# Patient Record
Sex: Female | Born: 1975 | Race: Black or African American | Hispanic: No | Marital: Single | Smoking: Former smoker
Health system: Southern US, Community
[De-identification: ages and names within clinical notes are randomized; demographics above are authoritative.]

## PROBLEM LIST (undated history)

## (undated) DIAGNOSIS — O24419 Gestational diabetes mellitus in pregnancy, unspecified control: Secondary | ICD-10-CM

## (undated) DIAGNOSIS — O1490 Unspecified pre-eclampsia, unspecified trimester: Secondary | ICD-10-CM

## (undated) HISTORY — DX: Gestational diabetes mellitus in pregnancy, unspecified control: O24.419

## (undated) HISTORY — PX: TUBAL LIGATION: SHX77

## (undated) HISTORY — DX: Unspecified pre-eclampsia, unspecified trimester: O14.90

---

## 1997-07-08 ENCOUNTER — Other Ambulatory Visit: Admission: RE | Admit: 1997-07-08 | Discharge: 1997-07-08 | Payer: Self-pay | Admitting: Obstetrics and Gynecology

## 1997-09-09 ENCOUNTER — Emergency Department (HOSPITAL_COMMUNITY): Admission: EM | Admit: 1997-09-09 | Discharge: 1997-09-09 | Payer: Self-pay | Admitting: Emergency Medicine

## 1999-01-07 ENCOUNTER — Other Ambulatory Visit: Admission: RE | Admit: 1999-01-07 | Discharge: 1999-01-07 | Payer: Self-pay | Admitting: Obstetrics and Gynecology

## 1999-05-08 ENCOUNTER — Other Ambulatory Visit: Admission: RE | Admit: 1999-05-08 | Discharge: 1999-05-08 | Payer: Self-pay | Admitting: Obstetrics and Gynecology

## 1999-10-21 ENCOUNTER — Emergency Department (HOSPITAL_COMMUNITY): Admission: EM | Admit: 1999-10-21 | Discharge: 1999-10-21 | Payer: Self-pay | Admitting: Emergency Medicine

## 1999-10-22 ENCOUNTER — Other Ambulatory Visit: Admission: RE | Admit: 1999-10-22 | Discharge: 1999-10-22 | Payer: Self-pay | Admitting: *Deleted

## 2000-03-01 ENCOUNTER — Other Ambulatory Visit: Admission: RE | Admit: 2000-03-01 | Discharge: 2000-03-01 | Payer: Self-pay | Admitting: Obstetrics and Gynecology

## 2003-12-10 ENCOUNTER — Other Ambulatory Visit: Admission: RE | Admit: 2003-12-10 | Discharge: 2003-12-10 | Payer: Self-pay | Admitting: Obstetrics and Gynecology

## 2003-12-13 ENCOUNTER — Ambulatory Visit (HOSPITAL_COMMUNITY): Admission: RE | Admit: 2003-12-13 | Discharge: 2003-12-13 | Payer: Self-pay | Admitting: Obstetrics and Gynecology

## 2003-12-17 ENCOUNTER — Inpatient Hospital Stay (HOSPITAL_COMMUNITY): Admission: AD | Admit: 2003-12-17 | Discharge: 2003-12-17 | Payer: Self-pay | Admitting: Obstetrics and Gynecology

## 2003-12-19 ENCOUNTER — Inpatient Hospital Stay (HOSPITAL_COMMUNITY): Admission: AD | Admit: 2003-12-19 | Discharge: 2003-12-19 | Payer: Self-pay | Admitting: Obstetrics and Gynecology

## 2003-12-22 ENCOUNTER — Inpatient Hospital Stay (HOSPITAL_COMMUNITY): Admission: AD | Admit: 2003-12-22 | Discharge: 2003-12-23 | Payer: Self-pay | Admitting: Obstetrics and Gynecology

## 2004-01-03 ENCOUNTER — Ambulatory Visit (HOSPITAL_COMMUNITY): Admission: RE | Admit: 2004-01-03 | Discharge: 2004-01-03 | Payer: Self-pay | Admitting: Obstetrics and Gynecology

## 2004-01-08 ENCOUNTER — Encounter: Payer: Self-pay | Admitting: Obstetrics and Gynecology

## 2004-01-09 ENCOUNTER — Encounter (INDEPENDENT_AMBULATORY_CARE_PROVIDER_SITE_OTHER): Payer: Self-pay | Admitting: Specialist

## 2004-01-09 ENCOUNTER — Ambulatory Visit (HOSPITAL_COMMUNITY): Admission: RE | Admit: 2004-01-09 | Discharge: 2004-01-09 | Payer: Self-pay | Admitting: Obstetrics and Gynecology

## 2004-07-17 ENCOUNTER — Emergency Department (HOSPITAL_COMMUNITY): Admission: EM | Admit: 2004-07-17 | Discharge: 2004-07-17 | Payer: Self-pay | Admitting: Emergency Medicine

## 2004-07-20 ENCOUNTER — Emergency Department (HOSPITAL_COMMUNITY): Admission: EM | Admit: 2004-07-20 | Discharge: 2004-07-20 | Payer: Self-pay | Admitting: Emergency Medicine

## 2006-04-07 ENCOUNTER — Inpatient Hospital Stay (HOSPITAL_COMMUNITY): Admission: AD | Admit: 2006-04-07 | Discharge: 2006-04-08 | Payer: Self-pay | Admitting: Obstetrics and Gynecology

## 2006-04-21 ENCOUNTER — Ambulatory Visit: Payer: Self-pay | Admitting: Internal Medicine

## 2006-04-28 ENCOUNTER — Encounter: Payer: Self-pay | Admitting: Cardiology

## 2006-04-28 ENCOUNTER — Ambulatory Visit: Payer: Self-pay

## 2006-05-31 ENCOUNTER — Ambulatory Visit: Payer: Self-pay | Admitting: Internal Medicine

## 2006-05-31 LAB — CONVERTED CEMR LAB
Basophils Absolute: 0 10*3/uL (ref 0.0–0.1)
Basophils Relative: 0.2 % (ref 0.0–1.0)
Eosinophils Absolute: 0.1 10*3/uL (ref 0.0–0.6)
Eosinophils Relative: 0.8 % (ref 0.0–5.0)
HCT: 34.1 % — ABNORMAL LOW (ref 36.0–46.0)
Hemoglobin: 11.8 g/dL — ABNORMAL LOW (ref 12.0–15.0)
Lymphocytes Relative: 15 % (ref 12.0–46.0)
MCHC: 34.5 g/dL (ref 30.0–36.0)
MCV: 92.5 fL (ref 78.0–100.0)
Monocytes Absolute: 0.9 10*3/uL — ABNORMAL HIGH (ref 0.2–0.7)
Monocytes Relative: 9.5 % (ref 3.0–11.0)
Neutro Abs: 7.5 10*3/uL (ref 1.4–7.7)
Neutrophils Relative %: 74.5 % (ref 43.0–77.0)
Platelets: 191 10*3/uL (ref 150–400)
RBC: 3.68 M/uL — ABNORMAL LOW (ref 3.87–5.11)
RDW: 12.5 % (ref 11.5–14.6)
WBC: 10 10*3/uL (ref 4.5–10.5)

## 2006-06-13 ENCOUNTER — Encounter: Admission: RE | Admit: 2006-06-13 | Discharge: 2006-06-13 | Payer: Self-pay | Admitting: Obstetrics and Gynecology

## 2006-07-19 ENCOUNTER — Ambulatory Visit: Payer: Self-pay | Admitting: Internal Medicine

## 2006-09-13 ENCOUNTER — Inpatient Hospital Stay (HOSPITAL_COMMUNITY): Admission: AD | Admit: 2006-09-13 | Discharge: 2006-09-13 | Payer: Self-pay | Admitting: Obstetrics and Gynecology

## 2006-09-22 ENCOUNTER — Encounter: Admission: RE | Admit: 2006-09-22 | Discharge: 2006-10-05 | Payer: Self-pay | Admitting: Obstetrics and Gynecology

## 2006-10-11 ENCOUNTER — Ambulatory Visit: Payer: Self-pay | Admitting: Family Medicine

## 2006-10-15 ENCOUNTER — Inpatient Hospital Stay (HOSPITAL_COMMUNITY): Admission: AD | Admit: 2006-10-15 | Discharge: 2006-10-16 | Payer: Self-pay | Admitting: Obstetrics & Gynecology

## 2006-10-18 ENCOUNTER — Ambulatory Visit: Payer: Self-pay | Admitting: Obstetrics & Gynecology

## 2006-10-25 ENCOUNTER — Ambulatory Visit: Payer: Self-pay | Admitting: Obstetrics & Gynecology

## 2006-11-01 ENCOUNTER — Ambulatory Visit: Payer: Self-pay | Admitting: Family Medicine

## 2006-11-07 ENCOUNTER — Inpatient Hospital Stay (HOSPITAL_COMMUNITY): Admission: RE | Admit: 2006-11-07 | Discharge: 2006-11-07 | Payer: Self-pay | Admitting: Obstetrics and Gynecology

## 2006-11-08 ENCOUNTER — Encounter (INDEPENDENT_AMBULATORY_CARE_PROVIDER_SITE_OTHER): Payer: Self-pay | Admitting: Obstetrics and Gynecology

## 2006-11-08 ENCOUNTER — Inpatient Hospital Stay (HOSPITAL_COMMUNITY): Admission: RE | Admit: 2006-11-08 | Discharge: 2006-11-11 | Payer: Self-pay | Admitting: Obstetrics and Gynecology

## 2006-11-15 ENCOUNTER — Inpatient Hospital Stay (HOSPITAL_COMMUNITY): Admission: AD | Admit: 2006-11-15 | Discharge: 2006-11-17 | Payer: Self-pay | Admitting: Obstetrics and Gynecology

## 2007-01-23 ENCOUNTER — Ambulatory Visit: Payer: Self-pay | Admitting: Family Medicine

## 2007-01-23 DIAGNOSIS — O99810 Abnormal glucose complicating pregnancy: Secondary | ICD-10-CM | POA: Insufficient documentation

## 2007-01-23 LAB — CONVERTED CEMR LAB: Blood Glucose, Fingerstick: 81

## 2007-01-25 LAB — CONVERTED CEMR LAB
BUN: 10 mg/dL (ref 6–23)
CO2: 31 meq/L (ref 19–32)
Calcium: 9.5 mg/dL (ref 8.4–10.5)
Chloride: 102 meq/L (ref 96–112)
Creatinine, Ser: 0.7 mg/dL (ref 0.4–1.2)
GFR calc Af Amer: 127 mL/min
GFR calc non Af Amer: 105 mL/min
Glucose, Bld: 80 mg/dL (ref 70–99)
Hgb A1c MFr Bld: 6.2 % — ABNORMAL HIGH (ref 4.6–6.0)
Potassium: 4.3 meq/L (ref 3.5–5.1)
Sodium: 140 meq/L (ref 135–145)
TSH: 0.69 microintl units/mL (ref 0.35–5.50)

## 2007-06-20 ENCOUNTER — Emergency Department (HOSPITAL_COMMUNITY): Admission: EM | Admit: 2007-06-20 | Discharge: 2007-06-20 | Payer: Self-pay | Admitting: Emergency Medicine

## 2007-06-20 ENCOUNTER — Telehealth: Payer: Self-pay | Admitting: Family Medicine

## 2007-06-28 ENCOUNTER — Ambulatory Visit: Payer: Self-pay | Admitting: Family Medicine

## 2007-06-28 DIAGNOSIS — R51 Headache: Secondary | ICD-10-CM

## 2007-06-28 DIAGNOSIS — R519 Headache, unspecified: Secondary | ICD-10-CM | POA: Insufficient documentation

## 2008-09-25 ENCOUNTER — Ambulatory Visit: Payer: Self-pay | Admitting: Family Medicine

## 2008-09-25 DIAGNOSIS — J069 Acute upper respiratory infection, unspecified: Secondary | ICD-10-CM | POA: Insufficient documentation

## 2009-02-12 ENCOUNTER — Ambulatory Visit: Payer: Self-pay | Admitting: Family Medicine

## 2009-02-12 DIAGNOSIS — R490 Dysphonia: Secondary | ICD-10-CM

## 2009-09-02 ENCOUNTER — Ambulatory Visit: Payer: Self-pay | Admitting: Family Medicine

## 2009-09-02 DIAGNOSIS — R599 Enlarged lymph nodes, unspecified: Secondary | ICD-10-CM | POA: Insufficient documentation

## 2009-09-02 DIAGNOSIS — K5900 Constipation, unspecified: Secondary | ICD-10-CM | POA: Insufficient documentation

## 2009-09-03 LAB — CONVERTED CEMR LAB
Basophils Absolute: 0 10*3/uL (ref 0.0–0.1)
Basophils Relative: 0.3 % (ref 0.0–3.0)
EBV VCA IgG: 6.31 — ABNORMAL HIGH
EBV VCA IgM: 0.42
Eosinophils Absolute: 0 10*3/uL (ref 0.0–0.7)
Eosinophils Relative: 0.3 % (ref 0.0–5.0)
HCT: 43.2 % (ref 36.0–46.0)
Hemoglobin: 14.5 g/dL (ref 12.0–15.0)
Lymphocytes Relative: 22.2 % (ref 12.0–46.0)
Lymphs Abs: 1.3 10*3/uL (ref 0.7–4.0)
MCHC: 33.6 g/dL (ref 30.0–36.0)
MCV: 93.1 fL (ref 78.0–100.0)
Monocytes Absolute: 0.8 10*3/uL (ref 0.1–1.0)
Monocytes Relative: 13 % — ABNORMAL HIGH (ref 3.0–12.0)
Neutro Abs: 3.9 10*3/uL (ref 1.4–7.7)
Neutrophils Relative %: 64.2 % (ref 43.0–77.0)
Platelets: 186 10*3/uL (ref 150.0–400.0)
RBC: 4.64 M/uL (ref 3.87–5.11)
RDW: 13.2 % (ref 11.5–14.6)
WBC: 6 10*3/uL (ref 4.5–10.5)

## 2010-02-10 NOTE — Assessment & Plan Note (Signed)
Summary: swollen glands on lft side of neck/very sore/fatique/cjr   Vital Signs:  Patient profile:   35 year old female Weight:      119 pounds BMI:     19.87 Temp:     98.8 degrees F oral BP sitting:   94 / 60  (left arm) Cuff size:   regular  Vitals Entered By: Raechel Ache, RN (September 02, 2009 9:46 AM) CC: C/o swollen, tender neck on L side, constipation and tender belly.   Allergies (verified): No Known Drug Allergies  Past History:  Past Medical History: Gestational diabetes with her last pregnancy pre-eclampsia with her last pregnancy sees Dr. Maxie Better for GYN exams  Past Surgical History:  Tubal ligation   Impression & Recommendations:  Problem # 1:  CERVICAL LYMPHADENOPATHY (ICD-785.6)  Her updated medication list for this problem includes:    Cefdinir 300 Mg Caps (Cefdinir) .Marland Kitchen..Marland Kitchen Two times a day  Orders: Venipuncture (16109) TLB-CBC Platelet - w/Differential (85025-CBCD) T- * Misc. Laboratory test [phone])  Problem # 2:  CONSTIPATION (ICD-564.00)  Her updated medication list for this problem includes:    Miralax Powd (Polyethylene glycol 3350) ..... Once daily  Complete Medication List: 1)  Mirena 20 Mcg/24hr Iud (Levonorgestrel) 2)  Cefdinir 300 Mg Caps (Cefdinir) .... Two times a day 3)  Miralax Powd (Polyethylene glycol 3350) .... Once daily  Patient Instructions: 1)  Start on Cefdinir. Get labs. Start on Miralax daily.  Prescriptions: CEFDINIR 300 MG CAPS (CEFDINIR) two times a day  #20 x 0   Entered and Authorized by:   Nelwyn Salisbury MD   Signed by:   Nelwyn Salisbury MD on 09/02/2009   Method used:   Electronically to        [facility] [address] [id number]* (retail)       [address] [place], [place]  [address]       Ph: [phone]       Fax: [phone]   RxID:   [id number]   Appended Document: swollen glands on lft side of neck/very sore/fatique/cjr     Allergies: No Known Drug Allergies  Review of  Systems  The patient denies anorexia, fever, weight loss, weight gain, vision loss, decreased hearing, hoarseness, chest pain, syncope, dyspnea on exertion, peripheral edema, prolonged cough, headaches, hemoptysis, melena, hematochezia, severe indigestion/heartburn, hematuria, incontinence, genital sores, muscle weakness, suspicious skin lesions, transient blindness, difficulty walking, depression, unusual weight change, abnormal bleeding, enlarged lymph nodes, angioedema, breast masses, and testicular masses.    Physical Exam  General:  Well-developed,well-nourished,in no acute distress; alert,appropriate and cooperative throughout examination Head:  Normocephalic and atraumatic without obvious abnormalities. No apparent alopecia or balding. Eyes:  No corneal or conjunctival inflammation noted. EOMI. Perrla. Funduscopic exam benign, without hemorrhages, exudates or papilledema. Vision grossly normal. Ears:  External ear exam shows no significant lesions or deformities.  Otoscopic examination reveals clear canals, tympanic membranes are intact bilaterally without bulging, retraction, inflammation or discharge. Hearing is grossly normal bilaterally. Nose:  External nasal examination shows no deformity or inflammation. Nasal mucosa are pink and moist without lesions or exudates. Mouth:  OP is red without exudate Neck:  shotty tender AC nodes Lungs:  Normal respiratory effort, chest expands symmetrically. Lungs are clear to auscultation, no crackles or wheezes.   Complete Medication List: 1)  Mirena 20 Mcg/24hr Iud (Levonorgestrel) 2)  Cefdinir 300 Mg Caps (Cefdinir) .... Two times a day 3)  Miralax Powd (Polyethylene glycol  3350) .... Once daily

## 2010-02-10 NOTE — Assessment & Plan Note (Signed)
Summary: LARYNGITIS, CONGESTION // RS   Vital Signs:  Patient profile:   35 year old female Weight:      117 pounds Temp:     98.8 degrees F oral Pulse rate:   101 / minute BP sitting:   102 / 62  (left arm) Cuff size:   regular  Vitals Entered By: Alfred Levins, CMA (February 12, 2009 9:33 AM) CC: laryngitis x4 days   History of Present Illness: Here for 4 days of hoarseness which started last weekend. She has no other symptoms at all except some mild sneezing and some PND. No fever or ST or cough. On no meds. She has not been exposed to smoke or chemicals, and she has not been singing or straining her voice. She works on the phone all day as a Engineer, building services, so today she did not go to work.   Current Medications (verified): 1)  None  Allergies (verified): No Known Drug Allergies  Past History:  Past Medical History: Reviewed history from 01/23/2007 and no changes required. Gestational diabetes with her last pregnancy pre-eclampsia with her last pregnancy  Past Surgical History: Reviewed history from 01/23/2007 and no changes required. Denies surgical history  Review of Systems  The patient denies anorexia, fever, weight loss, weight gain, vision loss, decreased hearing, hoarseness, chest pain, syncope, dyspnea on exertion, peripheral edema, prolonged cough, headaches, hemoptysis, abdominal pain, melena, hematochezia, severe indigestion/heartburn, hematuria, incontinence, genital sores, muscle weakness, suspicious skin lesions, transient blindness, difficulty walking, depression, unusual weight change, abnormal bleeding, enlarged lymph nodes, angioedema, breast masses, and testicular masses.    Physical Exam  General:  Well-developed,well-nourished,in no acute distress; alert,appropriate and cooperative throughout examination Head:  Normocephalic and atraumatic without obvious abnormalities. No apparent alopecia or balding. Eyes:  No corneal or conjunctival  inflammation noted. EOMI. Perrla. Funduscopic exam benign, without hemorrhages, exudates or papilledema. Vision grossly normal. Ears:  External ear exam shows no significant lesions or deformities.  Otoscopic examination reveals clear canals, tympanic membranes are intact bilaterally without bulging, retraction, inflammation or discharge. Hearing is grossly normal bilaterally. Nose:  External nasal examination shows no deformity or inflammation. Nasal mucosa are pink and moist without lesions or exudates. Mouth:  Oral mucosa and oropharynx without lesions or exudates.  Teeth in good repair. Neck:  No deformities, masses, or tenderness noted. Lungs:  Normal respiratory effort, chest expands symmetrically. Lungs are clear to auscultation, no crackles or wheezes. Her voice is hoarse   Impression & Recommendations:  Problem # 1:  HOARSENESS (AVW-098.11)  Patient Instructions: 1)  this possibly has an allergic etiology, but it is unclear. We will put her out of work today and tomorrow, she is to rest her voice as much as possible and drink lots of fluids. try Claritin for a few days.  2)  Please schedule a follow-up appointment as needed .

## 2010-05-26 NOTE — Assessment & Plan Note (Signed)
[facility]                             PULMONARY OFFICE NOTE   NAME:[name], Samantha Stuart                     MRN:          [id number]  DATE:07/19/2006                            DOB:          1975-01-29    PROBLEM LIST:  1. Dyspnea.  2. Elevated D-dimer.  3. Pregnant with twins.   HISTORY:  Her pregnancy proceeds apparently without problems.  She is  more comfortable now and feels that she is just adjusting to a pregnancy  with twins.  I went over with her the input we had received from Hulen Luster, PharmD, who sought opinion from his colleagues about the D-dimer  issue while pregnant.  Consensus was that pregnancy does raise the D-  dimer leaving [place] unsure of significance in the absence of other clues.  Some would be willing to utilize radioactive testing modalities, others  have felt we would have been better not to do the D-dimer test in the  first place, so the question would not come up.  [name] was here  with her mother and we reviewed all of this.  She has had no sudden  events, no discomfort with her legs, nothing new.   MEDICATIONS:  Prenatal vitamins.   No medication allergy.   OBJECTIVE:  VITAL SIGNS:  Weight  133 pounds, BP 108/58, pulse 79, room  air saturation 100%.  Her pulse is regular.  HEART:  There is a trace systolic flow murmur at the left sternal  border.  Heart sounds are regular.  NECK:  There is no neck vein distention or stridor.  LUNGS:  Quiet and clear.  Breathing is unlabored.  EXTREMITIES:  Calves are soft, no edema.  Negative Homan's.   IMPRESSION:  We really do not see any reason to assume her initial  dyspnea complaint was based on anything more than pregnancy with twins  at this point.   I reviewed circumstances that might suggest pulmonary embolism or deep  vein thrombosis.  She is quite comfortable letting the issue drop and  returning p.r.n.  I think it is very reasonable to let her be followed  routinely by Dr. Cherly Hensen and I would be happy to see her again if there  are future concerns.    Clinton D. Maple Hudson, MD, Tonny Bollman, FACP  Electronically Signed   CDY/MedQ  DD: 08/06/2006  DT: 08/08/2006  Job #: [id number]   cc:   Maxie Better, M.D.

## 2010-05-26 NOTE — Op Note (Signed)
NAMEASHLEYANN, Samantha Stuart              ACCOUNT NO.:  [id number]   MEDICAL RECORD NO.:  [id number]          PATIENT TYPE:  INP   LOCATION:                                FACILITY[facility]  [facility]   PHYSICIAN:  Maxie Better, M.D.DATE OF BIRTH:  1975/09/25   DATE OF PROCEDURE:  11/08/2006  DATE OF DISCHARGE:                               OPERATIVE REPORT   PREOPERATIVE DIAGNOSES:  1. Twin gestation at 55 weeks with mature fetal lung indices.  2. Class A2 gestational diabetes.  3. Desires sterilization procedure.   POSTOPERATIVE DIAGNOSES:  1. Twin gestation at 18 weeks with mature fetal lung indices.  2. Class A2 gestational diabetes.  3. Desires sterilization.   OPERATION/PROCEDURE:  1. Primary cesarean section.  2. Modified Pomeroy tubal ligation.   ANESTHESIA:  Spinal.   SURGEON:  Maxie Better, M.D..   ASSISTANT:  Marlinda Mike, C.N.M.   DESCRIPTION OF PROCEDURE:  Under adequate spinal anesthesia, the patient  is placed in the supine position with a left lateral tilt.  She was  sterilely prepped and draped in the usual fashion.  Indwelling Foley  catheter was sterilely placed.  Marcaine 0.25% was injected along the  planned Pfannenstiel skin incision.  Pfannenstiel skin incision was then  made, carried down to the rectus fascia.  The rectus fascia was opened  transversely.  The rectus fascia was then bluntly and sharply dissected  off the rectus muscle in the superior and inferior fashion.  Rectus  muscles split in midline.  The parietal peritoneum was entered sharply  and extended.  The vesicouterine peritoneum was opened transversely.  Bladder was then bluntly dissected off the lower uterine segment.  Large  venous vessels were noted in the lower uterine segment.  Nonetheless, a  low transverse uterine incision was then made and extended with bandage  scissors.  Artificial rupture of membranes had been for performed.  Clear fluid noted.  Subsequent delivery of a live female  from the maternal  left was accomplished, was bulb suctioned on the abdomen.  Cord was  clamped and cut.  The baby had a cord around the left foot.  Baby was  transferred to the awaiting pediatrician who assigned Apgars of 8 and 9  at one and five minutes.  Second amniotic membranes was intact.  After  identifying and stabilizing the presentation, artificial rupture of  membranes was performed on the second twin.  Subsequent delivery of a  live female was also accomplished.  Baby was bulb-suctioned on the  abdomen.  Cord was clamped and cut.  The baby was transferred to the  awaiting pediatrician who also assigned Apgars of 8 and 9 at one and  five minutes.  The placenta which was x2 was manually removed.  Uterine  cavity was cleaned of debris.  Uterine incision had no extension, was  closed in two layers, the first layer of 0 Monocryl running locked  stitch and second layer with imbricating using 0 Monocryl suture.  Bleeding along the intermittently along the incision site was hemostased  with figure-of-eight 0 Monocryl sutures.  The abdomen was then copiously  irrigated, suctioned of debris and small bleeders were cauterized along  the peritoneal edges.   Attention was then turned to the fallopian tubes and after the  reascertaining that the patient had desires to proceed with the  sterilization.  Both ovaries were noted to be normal.  Both fallopian  tubes were identified down to the fimbriated end.  Mid portion of tube  was grasped with a Babcock.  The underlying mesosalpinx was opened with  cautery.  The proximal distal portion of tube was tied with 0 chromic  suture x2 proximally and distally.  The intervening segment of tube was  removed on both sides and sent to pathology.   Abdomen was again copiously irrigated, suctioned, small bleeders  cauterized with good hemostasis noted.  The vesicouterine peritoneum was  not closed.  The parietal peritoneum was closed with 2-0 Vicryl  suture.  The skin approximated using a 0 Vicryl x2.  There was hardly any  subcutaneous fat.  Skin was approximated using Ethicon staples.   SPECIMENS:  Placenta x2 sent to pathology.  Portion of right and left  fallopian tube also sent to pathology.   ESTIMATED BLOOD LOSS:  1000 mL.   INTRAOPERATIVE FLUID:  2900 mL crystalloid.   URINARY OUTPUT:  Urine output was 40 mL clear yellow urine.   COUNTS:  Sponge and instrument counts x2 were correct.   COMPLICATIONS:  None.   DISPOSITION:  Weight of the babies was 5 pounds 7 ounces and 5 pounds 12  ounces respectively.  The patient tolerated the procedure well and was  transferred to recovery in stable condition.      Maxie Better, M.D.  Electronically Signed     [place]/MEDQ  D:  11/08/2006  T:  11/09/2006  Job:  [id number]

## 2010-05-26 NOTE — Assessment & Plan Note (Signed)
[facility]                             PULMONARY OFFICE NOTE   NAME:Samantha Stuart, Samantha Stuart                     MRN:          [id number]  DATE:05/31/2006                            DOB:          [birth year]    PROBLEM:  1. Dyspnea.  2. Elevated D-dimer.  3. Pregnant with twins.   HISTORY:  She returns for followup of pregnancy progressing normally,  due November 25.  She reports no change in shortness of breath,  especially with exertion.  Sometimes she will get a minor substernal  tingle but no active reflux, no exertional chest pain or pleuritic  pains.  She has to sleep on her back to avoid shortness of breath at  night and has trouble with stairs or walking across a parking lot.  Spirometry, April 10, had shown mild restriction with an FEV1/FEC ratio  of 0.67 indicating some obstructive component.  Scores went down after  bronchodilator.  Chest x-ray had shown no active disease.  Dopplers of  leg veins were negative.  The D-dimer on April 10 was 0.57.  An  echocardiogram was normal with an ejection fraction of 65% and no  evidence of right ventricular overload.  Dopplers of leg veins were  negative with no evidence of clot  or obstruction.  My staff walked her  in the hallway for a total of 185 feet.  Initial heart rate at rest was  102 and at the end of the exercise was 94.  Initial room air saturation  at rest was 100%.  Saturation at the end of the exercise was 99% on room  air.  She did have to stop for a minute to catch her breath but oxygen  saturations never dropped below 97%.  She complained of dizziness.  A  CBC, May 20, recorded a hemoglobin of 11.8 with hematocrit of 34.1.  This does not seem low enough to cause symptoms.  A repeat D-dimer on  this date (May 31, 2006), was higher at 1.52.  I emailed Hulen Luster,  Pharm D. who is an expert on clotting issues in the pharmacy at [facility] to  enquire about D-dimer during pregnancy.  My own  research had already  indicated that it was apt to go up during pregnancy but that it is  varied depending on the particular assay used.  He sent the clinical  issue out to a number of the national level experts with whom he works,  and we received a series of responses.  Most felt that the D-dimer assay  was going to go up during pregnancy and did not mean much of anything.  One felt that the radiation risk was so small as to be relatively worth  ignoring and recommended that a ventilation perfusion lung scan be done.   IMPRESSION:  I favored this being dyspnea related to the physiologic  burden of being pregnant with twins and very much doubt that she has had  a pulmonary embolism.  However, there is uncertainty. I have discussed  this with her, and I will contact Dr. Cherly Hensen to review options.  Currently, I have scheduled return in two months simply to have a date  on the calendar but  expect to have further clinical decisions made soon.  Ms Humm  understands the uncertainty and the importance of contacting us if there  is a clinical change for the worse.     Clinton D. Maple Hudson, MD, Tonny Bollman, FACP  Electronically Signed    CDY/MedQ  DD: 06/13/2006  DT: 06/14/2006  Job #: [id number]   cc:   Maxie Better, M.D.

## 2010-05-29 NOTE — Op Note (Signed)
Samantha Stuart, Samantha Stuart              ACCOUNT NO.:  [id number]   MEDICAL RECORD NO.:  [id number]          PATIENT TYPE:  AMB   LOCATION:  SDC                           FACILITY:  [facility]   PHYSICIAN:  Osborn Coho, M.D.   DATE OF BIRTH:  28-May-1975   DATE OF PROCEDURE:  DATE OF DISCHARGE:                                 OPERATIVE REPORT   PREOPERATIVE DIAGNOSIS:  Missed abortion versus ectopic.   POSTOPERATIVE DIAGNOSIS:  Missed abortion versus ectopic.   PROCEDURE:  Suction dilation and curettage.   ANESTHESIA:  MAC.   ATTENDING:  Osborn Coho, M.D.   FLUIDS:  600 mL.   ESTIMATED BLOOD LOSS:  Minimal (less than 10 mL).   URINE OUTPUT:  Approximately 50 mL via straight catheter prior to procedure.   COMPLICATIONS:  None.   FINDINGS:  A small amount of POCs.   PATHOLOGY:  POCs.   PROCEDURE IN DETAIL:  The patient was taken to the operating room after the  risks, benefits, and alternatives discussed with the patient.  The patient  verbalized understanding and consent signed and witnessed.  The patient was  placed under MAC for anesthesia and prepped and draped in the normal sterile  fashion in the dorsal lithotomy position.  A bivalve speculum was placed in  the patient's vagina and a paracervical block administered with a total of  10 mL of 2% lidocaine.  The anterior lip of the cervix was grasped with a  single-tooth tenaculum and the cervix was dilated for passage of a size 7  suctioning curette.  The uterus was sounded to 8 cm.  Suction curettage was  performed with a small amount of products of conception returning.  Sharp  curettage was performed and a gritty texture was noted.  Suction curettage  was performed once again to remove any remaining debris.  Instruments were  removed.  Hemostasis was noted at the tenaculum site.  Sponge and lap count  were correct.  The patient tolerated the procedure well and was returned to  the recovery room in good condition.     Ange   AR/MEDQ  D:  01/09/2004  T:  01/09/2004  Job:  [id number]

## 2010-05-29 NOTE — Discharge Summary (Signed)
NAMESYBRINA, Samantha Stuart              ACCOUNT NO.:  [id number]   MEDICAL RECORD NO.:  [id number]          PATIENT TYPE:  INP   LOCATION:  9148                          [facility]ILITY[facility]   PHYSICIAN:  Maxie Better, M.D.DATE OF BIRTH:  [birth year]   DATE OF ADMISSION:  11/08/2006  DATE OF DISCHARGE:  11/11/2006                               DISCHARGE SUMMARY   ADMISSION DIAGNOSIS:  1. Twin gestation.  2. Intrauterine gestation of 36 weeks.  3. Class A2 gestational diabetes.  4. Desires sterilization.   DISCHARGE DIAGNOSIS:  1. Twin gestation delivered.  2. Intrauterine gestation at 36+ weeks.  3. Class A2 gestational diabetes.  4. Desires sterilization.   PROCEDURE:  Primary cesarean section, modified Pomeroy tubal ligation.   HISTORY OF PRESENT ILLNESS:  This is a 35 year old gravida 6, para 1-0-4-  1 female at 15 weeks with class A2 gestational diabetes and history of  preterm labor is now admitted for a primary cesarean section after  amniocentesis documented mature fetal lung indices. The patient requests  permanent sterilization.   HOSPITAL COURSE:  The patient was admitted to [facility].  She  underwent a primary cesarean section, live female x2 were delivered twin  A was 5 pounds 7 ounces twin B was 5 pounds 12 ounces, Apgars of 8 and 9  for both. Normal tubes and ovaries were noted bilaterally.  The patient  had an uncomplicated post operative course.  The pathology confirmed  portion of the tube had been transected. Her CBC on postoperative day #1  showed a hemoglobin 9.4, hematocrit 27.5, white count 10.3, platelet  count 188,000. By postop day #3 the patient had slight erythema of the  incision tenderness above and was empirically started on Keflex.  She  tolerated a regular diet.  She was supplementing and as well as breast  feeding. She was otherwise deemed well to be discharged.   DISPOSITION:  Home.   DISCHARGE MEDICATIONS:  Were  1. Percocet 1-2 tablets  every 3-4 hours p.r.n. pain.  2. Motrin 600 mg one p.o. every 8 hours  p.r.n. pain.  3. Keflex 1000 mg one p.o. every 6 hours for 7 days.  4. Iron supplementation one p.o. daily.   CONDITION:  Stable.   FOLLOW-UP APPOINTMENT:  At [facility] for 6 weeks postpartum and  for 1 week for staple removal and incision check.   DISCHARGE INSTRUCTIONS:  Otherwise per the postpartum booklet given.      Maxie Better, M.D.  Electronically Signed     [place]/MEDQ  D:  12/06/2006  T:  12/06/2006  Job:  [id number]

## 2010-05-29 NOTE — Assessment & Plan Note (Signed)
[facility]                             PULMONARY OFFICE NOTE   NAME:Samantha Stuart, Samantha Stuart                     MRN:          [id number]  DATE:04/21/2006                            DOB:          10/28/1975    PROBLEM:  Pulmonary consultation at the kind request of Dr. Cherly Hensen for  this 35 year old woman, seven weeks pregnant, with acute onset dyspnea.   HISTORY:  This is a light smoker who denies prior history of respiratory  problems. Yesterday, she became acutely aware of dyspnea while walking  from her car. Since then, she has felt short of breath on her phone at  work and is dyspneic with any activity. She is better at rest or laying  down, but not completely back to normal. There has not been chest pain,  cough, or wheeze. She has had no prior similar sensation.   MEDICATIONS:  None.   ALLERGIES:  No medication allergy.   REVIEW OF SYSTEMS:  She did have one sharp twinge just to the right of  the sternum yesterday which was very transient and self limited. No  cough or wheeze. In the last few days, she has been waking at night with  sweats, but not aware of dyspnea waking from sleep and without fever or  chills, purulent or bloody discharge, a change in bowel or bladder,  palpitation, nausea or vomiting. Occasional indigestion. Some discomfort  in the low suprapubic area that she attributes to a diagnosis of uterine  fibroids. There has been no swelling, tightness, or pain in her legs.   PAST HISTORY:  She is now seven weeks pregnant, uncomplicated, with  twins. Previous pregnancy was uncomplicated ten years ago. There is no  history of asthma, heart disease, clotting disorder, seasonal rhinitis,  or allergy. No surgeries. Uterine fibroids. No intolerance to latex,  contrast dye, or aspirin.   SOCIAL HISTORY:  Not married. One 67 year old child. Works in [employer]. She smokes about a pack of cigarettes a week and she quit a  month ago  when she discovered she was pregnant.   FAMILY HISTORY:  Mother with pulmonary hypertension and renal failure.   OBJECTIVE:  VITAL SIGNS:  Weight 115 pounds, blood pressure 108/68,  pulse regular 69, room air saturation 100%.  GENERAL:  Slender young woman not in obvious distress.  SKIN:  Clear.  ADENOPATHY:  None at the neck, supraclavicular areas, or axilla.  HEENT:  Clear with normal mucosa, no neck vein distension or strider.  CHEST:  Transient wheeze heard once at the right mid back and not heard  again. No dullness, rhonchi, or rub.  HEART:  Regular rhythm. P2 does not seem loud. There is no murmur or  gallop.  ABDOMEN:  Cold, mild bump.  EXTREMITIES:  No cyanosis, clubbing, or edema. Negative Homan's.   SPIROMETRY:  Mild restriction of exhaled volume with an FVC of 2.69 (70%  predicted), FEV1 2.21 (72%), ratio of 0.82. Small airway flows were  reduced at 65% of predicted. Scores were not improved by bronchodilator.  Some difficulty exhaling to meet criteria for a test  performance.   IMPRESSION:  Acute onset dyspnea in a pregnant woman. My first concern  with this story would be pulmonary embolism. Spontaneous pneumothorax,  asthma, or cardiomyopathy of pregnancy might be considered. She says  blood work at the onset of dyspnea was not reported to her as showing  anemia. Family history of pulmonary hypertension is recognized. I do not  see right heart failure signs at this time. We discussed what evaluation  could be done now with a minimum of radiation exposure.   PLAN:  1. 2D echocardiogram looking for evidence of cardiomyopathy or      pulmonary hypertension.  2. Shielded chest x-ray, one view.  3. Doppler leg vein examination for DVT.  4. D-dimer.  5. Spirometry before and after bronchodilator.  6. Schedule return one week, earlier p.r.n.     Clinton D. Maple Hudson, MD, Tonny Bollman, FACP  Electronically Signed    CDY/MedQ  DD: 04/21/2006  DT: 04/22/2006  Job #: [id number]    cc:   Maxie Better, M.D.

## 2010-10-08 LAB — POCT I-STAT, CHEM 8
BUN: 13
Calcium, Ion: 1.12
Chloride: 104
Creatinine, Ser: 0.8
Glucose, Bld: 99
HCT: 40
Hemoglobin: 13.6
Potassium: 4
Sodium: 138
TCO2: 26

## 2010-10-20 LAB — COMPREHENSIVE METABOLIC PANEL
ALT: 36 — ABNORMAL HIGH
ALT: 40 — ABNORMAL HIGH
ALT: 53 — ABNORMAL HIGH
AST: 27
AST: 30
AST: 40 — ABNORMAL HIGH
Albumin: 2.2 — ABNORMAL LOW
Albumin: 2.4 — ABNORMAL LOW
Albumin: 2.5 — ABNORMAL LOW
Alkaline Phosphatase: 135 — ABNORMAL HIGH
Alkaline Phosphatase: 142 — ABNORMAL HIGH
Alkaline Phosphatase: 157 — ABNORMAL HIGH
BUN: 10
BUN: 4 — ABNORMAL LOW
BUN: 7
CO2: 26
CO2: 27
CO2: 29
Calcium: 7.6 — ABNORMAL LOW
Calcium: 7.6 — ABNORMAL LOW
Calcium: 8.6
Chloride: 104
Chloride: 106
Chloride: 106
Creatinine, Ser: 0.73
Creatinine, Ser: 0.75
Creatinine, Ser: 0.98
GFR calc Af Amer: >60
GFR calc Af Amer: >60
GFR calc Af Amer: >60
GFR calc non Af Amer: >60
GFR calc non Af Amer: >60
GFR calc non Af Amer: >60
Glucose, Bld: 145 — ABNORMAL HIGH
Glucose, Bld: 96
Glucose, Bld: 98
Potassium: 3.8
Potassium: 4.1
Potassium: 4.2
Sodium: 137
Sodium: 138
Sodium: 139
Total Bilirubin: 0.3
Total Bilirubin: 0.5
Total Bilirubin: 0.5
Total Protein: 5.5 — ABNORMAL LOW
Total Protein: 6
Total Protein: 6

## 2010-10-20 LAB — CBC
HCT: 31.6 — ABNORMAL LOW
HCT: 30.3 — ABNORMAL LOW
HCT: 33.6 — ABNORMAL LOW
Hemoglobin: 10.7 — ABNORMAL LOW
Hemoglobin: 10.4 — ABNORMAL LOW
Hemoglobin: 11.4 — ABNORMAL LOW
MCHC: 33.9
MCHC: 33.9
MCHC: 34.4
MCV: 90
MCV: 90.4
MCV: 90.5
Platelets: 427 — ABNORMAL HIGH
Platelets: 427 — ABNORMAL HIGH
Platelets: 438 — ABNORMAL HIGH
RBC: 3.51 — ABNORMAL LOW
RBC: 3.35 — ABNORMAL LOW
RBC: 3.71 — ABNORMAL LOW
RDW: 14.4 — ABNORMAL HIGH
RDW: 14.2 — ABNORMAL HIGH
RDW: 14.4 — ABNORMAL HIGH
WBC: 5.9
WBC: 4.6
WBC: 5.3

## 2010-10-20 LAB — LACTATE DEHYDROGENASE
LDH: 201
LDH: 237
LDH: 239

## 2010-10-20 LAB — URIC ACID
Uric Acid, Serum: 7.1 — ABNORMAL HIGH
Uric Acid, Serum: 8.5 — ABNORMAL HIGH

## 2010-10-20 LAB — MAGNESIUM
Magnesium: 5.7 — ABNORMAL HIGH
Magnesium: 6.1

## 2010-10-21 LAB — CBC
HCT: 27.5 — ABNORMAL LOW
Hemoglobin: 12.1
Hemoglobin: 9.4 — ABNORMAL LOW
MCHC: 33.7
MCV: 90.1
MCV: 90.4
Platelets: 188
RBC: 3.98
WBC: 10.3

## 2010-10-21 LAB — BASIC METABOLIC PANEL
CO2: 20
Calcium: 8.6
Chloride: 109
GFR calc Af Amer: >60
Sodium: 137

## 2010-10-22 LAB — URINE MICROSCOPIC-ADD ON

## 2010-10-22 LAB — URINALYSIS, ROUTINE W REFLEX MICROSCOPIC
Bilirubin Urine: NEGATIVE
Ketones, ur: NEGATIVE
Nitrite: NEGATIVE
pH: 5.5

## 2010-10-23 LAB — URINALYSIS, ROUTINE W REFLEX MICROSCOPIC
Bilirubin Urine: NEGATIVE
Ketones, ur: NEGATIVE
Leukocytes, UA: NEGATIVE
Nitrite: NEGATIVE
Protein, ur: 100 — AB

## 2010-10-23 LAB — COMPREHENSIVE METABOLIC PANEL
ALT: 17
AST: 23
Albumin: 2.5 — ABNORMAL LOW
Calcium: 8.7
Creatinine, Ser: 0.55
GFR calc Af Amer: >60
Sodium: 134 — ABNORMAL LOW
Total Protein: 5.9 — ABNORMAL LOW

## 2010-10-23 LAB — URINE CULTURE

## 2010-10-23 LAB — BILE ACIDS, TOTAL: Bile Acids Total: 4 umol/L (ref 0–10)

## 2010-11-23 ENCOUNTER — Encounter: Payer: Self-pay | Admitting: Internal Medicine

## 2010-11-23 ENCOUNTER — Ambulatory Visit (INDEPENDENT_AMBULATORY_CARE_PROVIDER_SITE_OTHER): Payer: Managed Care, Other (non HMO) | Admitting: Internal Medicine

## 2010-11-23 DIAGNOSIS — B9789 Other viral agents as the cause of diseases classified elsewhere: Secondary | ICD-10-CM

## 2010-11-23 DIAGNOSIS — R52 Pain, unspecified: Secondary | ICD-10-CM

## 2010-11-23 DIAGNOSIS — B349 Viral infection, unspecified: Secondary | ICD-10-CM | POA: Insufficient documentation

## 2010-11-23 LAB — POCT INFLUENZA A/B: Influenza A, POC: NEGATIVE

## 2010-11-23 NOTE — Assessment & Plan Note (Signed)
36 year old [place] American female with fairly severe viral syndrome.  Test for influenza. Patient advised to stay out of work for the next 2 or 3 days, increase fluids and use ibuprofen as needed.  Patient advised to call office if symptoms persist or worsen.

## 2010-11-23 NOTE — Progress Notes (Signed)
  Subjective:    Patient ID: Samantha Stuart, female    DOB: [birth year], 35 y.o.   MRN: [id number]  HPI  35 year old American female who presents with sore throat and severe myalgias x1 week. Her symptoms started after recent visit to the dentist. She complains of aching all over and today is the worst day her symptoms. Patient also has associated cough which has been generally nonproductive. She denies fever. She has mild nausea but no vomiting. Negative for shortness of breath.  Review of Systems  positive headache     Past Medical History  Diagnosis Date  . Gestational diabetes   . Pre-eclampsia     History   Social History  . Marital Status: Single    Spouse Name: N/A    Number of Children: N/A  . Years of Education: N/A   Occupational History  . Not on file.   Social History Main Topics  . Smoking status: Former Games developer  . Smokeless tobacco: Not on file  . Alcohol Use: No  . Drug Use: No  . Sexually Active: Not on file   Other Topics Concern  . Not on file   Social History Narrative  . No narrative on file    Past Surgical History  Procedure Date  . Tubal ligation     Family History  Problem Relation Age of Onset  . Diabetes    . Hypertension    . Coronary artery disease      Allergies not on file  No current outpatient prescriptions on file prior to visit.    BP 94/64  Temp(Src) 98.7 F (37.1 C) (Oral)  Ht 5\' 4"  (1.626 m)  Wt 124 lb (56.246 kg)  BMI 21.28 kg/m2    Objective:   Physical Exam   Constitutional: Appears well-developed and well-nourished. No distress.  Head: Normocephalic and atraumatic.  Ear:  Right and left ear normal.  TMs clear.  Hearing is grossly normal Mouth/Throat:  mild oropharyngeal erythema Eyes: Conjunctivae are normal. Pupils are equal, round, and reactive to light.  Neck: Normal range of motion. Neck supple. No thyromegaly present. No carotid bruit Cardiovascular: Normal rate, regular rhythm and normal heart  sounds.  Exam reveals no gallop and no friction rub.  No murmur heard. Pulmonary/Chest: Effort normal and breath sounds normal.  No wheezes. No rales.  Skin: Skin is warm and dry.  Psychiatric: Normal mood and affect. Behavior is normal.      Assessment & Plan:

## 2010-11-26 ENCOUNTER — Telehealth: Payer: Self-pay | Admitting: Family Medicine

## 2010-11-26 NOTE — Telephone Encounter (Signed)
Please schedule last appt tomorrow for pt.

## 2010-11-26 NOTE — Telephone Encounter (Signed)
Pt called and was in to see Dr Artist Pais on 11/12 for viral inf. Pt was not given any meds, was told to get rest and drink fluids. Pt is still very sick. Cough/chest congestion/body aches. Pt has been out of work all week. Req abx, cough med. [facility] on [address] and [address].

## 2010-11-26 NOTE — Telephone Encounter (Signed)
Called pt and sch her for 2:30 on Friday 11/27/10 per Triage, as noted.

## 2010-11-27 ENCOUNTER — Ambulatory Visit: Payer: Managed Care, Other (non HMO) | Admitting: Family Medicine

## 2010-11-30 ENCOUNTER — Ambulatory Visit: Payer: Managed Care, Other (non HMO) | Admitting: Internal Medicine

## 2011-03-04 ENCOUNTER — Emergency Department (HOSPITAL_BASED_OUTPATIENT_CLINIC_OR_DEPARTMENT_OTHER)
Admission: EM | Admit: 2011-03-04 | Discharge: 2011-03-04 | Disposition: A | Discharge status: Home / Self Care | Payer: Managed Care, Other (non HMO) | Attending: Emergency Medicine | Admitting: Emergency Medicine

## 2011-03-04 ENCOUNTER — Encounter (HOSPITAL_BASED_OUTPATIENT_CLINIC_OR_DEPARTMENT_OTHER): Payer: Self-pay

## 2011-03-04 DIAGNOSIS — M549 Dorsalgia, unspecified: Secondary | ICD-10-CM | POA: Insufficient documentation

## 2011-03-04 DIAGNOSIS — R1013 Epigastric pain: Secondary | ICD-10-CM | POA: Insufficient documentation

## 2011-03-04 LAB — COMPREHENSIVE METABOLIC PANEL
BUN: 9 mg/dL (ref 6–23)
CO2: 30 mEq/L (ref 19–32)
Calcium: 10.9 mg/dL — ABNORMAL HIGH (ref 8.4–10.5)
Creatinine, Ser: 0.9 mg/dL (ref 0.50–1.10)
GFR calc Af Amer: >90 mL/min (ref >90)
GFR calc non Af Amer: 83 mL/min — ABNORMAL LOW (ref >90)
Glucose, Bld: 92 mg/dL (ref 70–99)

## 2011-03-04 LAB — URINALYSIS, ROUTINE W REFLEX MICROSCOPIC
Glucose, UA: NEGATIVE mg/dL
Hgb urine dipstick: NEGATIVE
Protein, ur: NEGATIVE mg/dL

## 2011-03-04 LAB — DIFFERENTIAL
Eosinophils Relative: 2 % (ref 0–5)
Lymphocytes Relative: 38 % (ref 12–46)
Lymphs Abs: 2.5 10*3/uL (ref 0.7–4.0)
Monocytes Absolute: 0.6 10*3/uL (ref 0.1–1.0)

## 2011-03-04 LAB — CBC
HCT: 41.8 % (ref 36.0–46.0)
MCV: 88.4 fL (ref 78.0–100.0)
RBC: 4.73 MIL/uL (ref 3.87–5.11)
RDW: 12.2 % (ref 11.5–15.5)
WBC: 6.7 10*3/uL (ref 4.0–10.5)

## 2011-03-04 MED ORDER — HYOSCYAMINE SULFATE 0.125 MG SL SUBL: 0.1250 mg | SUBLINGUAL_TABLET | SUBLINGUAL | Status: AC | PRN

## 2011-03-04 MED ORDER — ESOMEPRAZOLE MAGNESIUM 40 MG PO CPDR: 40.0000 mg | DELAYED_RELEASE_CAPSULE | Freq: Every day | ORAL | Status: DC

## 2011-03-04 MED ORDER — GI COCKTAIL ~~LOC~~
30.0000 mL | Freq: Once | ORAL | Status: AC
  Administered 2011-03-04: 30 mL via ORAL
  Filled 2011-03-04: qty 30

## 2011-03-04 NOTE — ED Provider Notes (Signed)
History     CSN: [id number]  Arrival date & time 03/04/11  1246   First MD Initiated Contact with Patient 03/04/11 1317      Chief Complaint  Patient presents with  . Abdominal Pain    (Consider location/radiation/quality/duration/timing/severity/associated sxs/prior treatment) Patient is a 36 y.o. female presenting with abdominal pain. The history is provided by the patient. No language interpreter was used.  Abdominal Pain The primary symptoms of the illness include abdominal pain. Episode onset: pain for over 1 year. The onset of the illness was gradual. The problem has been gradually worsening.  The patient states that she believes she is currently not pregnant. Additional symptoms associated with the illness include back pain.  Pt complains of pain in epigastric abdominal area.  Pt reports she also has pain in the left lower abdomen.  Pt reports she has had on and off for a year.   Past Medical History  Diagnosis Date  . Gestational diabetes   . Pre-eclampsia     Past Surgical History  Procedure Date  . Tubal ligation   . Cesarean section     Family History  Problem Relation Age of Onset  . Diabetes    . Hypertension    . Coronary artery disease      History  Substance Use Topics  . Smoking status: Former Games developer  . Smokeless tobacco: Not on file  . Alcohol Use: No    OB History    Grav Para Term Preterm Abortions TAB SAB Ect Mult Living                  Review of Systems  Gastrointestinal: Positive for abdominal pain.  Musculoskeletal: Positive for back pain.  All other systems reviewed and are negative.    Allergies  Review of patient's allergies indicates no known allergies.  Home Medications  No current outpatient prescriptions on file.  BP 129/81  Pulse 64  Temp(Src) 97.6 F (36.4 C) (Oral)  Resp 16  Ht 5\' 4"  (1.626 m)  Wt 117 lb (53.071 kg)  BMI 20.08 kg/m2  SpO2 100%  Physical Exam  Constitutional: She is oriented to person,  place, and time. She appears well-developed and well-nourished.  HENT:  Head: Normocephalic and atraumatic.  Right Ear: External ear normal.  Left Ear: External ear normal.  Eyes: Conjunctivae and EOM are normal. Pupils are equal, round, and reactive to light.  Neck: Normal range of motion. Neck supple.  Cardiovascular: Normal rate and normal heart sounds.   Pulmonary/Chest: Effort normal and breath sounds normal.  Abdominal: Soft.       Diffusely tender,  No right upper quadrant tenderness,   Musculoskeletal: Normal range of motion.  Neurological: She is alert and oriented to person, place, and time.  Skin: Skin is warm.  Psychiatric: She has a normal mood and affect.    ED Course  Procedures (including critical care time)   Labs Reviewed  PREGNANCY, URINE  URINALYSIS, ROUTINE W REFLEX MICROSCOPIC  CBC  DIFFERENTIAL  COMPREHENSIVE METABOLIC PANEL  LIPASE, BLOOD   No results found.   No diagnosis found.    MDM    Results for orders placed during the hospital encounter of 03/04/11  PREGNANCY, URINE      Component Value Range   Preg Test, Ur NEGATIVE  NEGATIVE   URINALYSIS, ROUTINE W REFLEX MICROSCOPIC      Component Value Range   Color, Urine YELLOW  YELLOW    APPearance CLEAR  CLEAR  Specific Gravity, Urine 1.015  1.005 - 1.030    pH 6.5  5.0 - 8.0    Glucose, UA NEGATIVE  NEGATIVE (mg/dL)   Hgb urine dipstick NEGATIVE  NEGATIVE    Bilirubin Urine NEGATIVE  NEGATIVE    Ketones, ur NEGATIVE  NEGATIVE (mg/dL)   Protein, ur NEGATIVE  NEGATIVE (mg/dL)   Urobilinogen, UA 1.0  0.0 - 1.0 (mg/dL)   Nitrite NEGATIVE  NEGATIVE    Leukocytes, UA NEGATIVE  NEGATIVE   CBC      Component Value Range   WBC 6.7  4.0 - 10.5 (K/uL)   RBC 4.73  3.87 - 5.11 (MIL/uL)   Hemoglobin 14.2  12.0 - 15.0 (g/dL)   HCT 40.9  81.1 - 91.4 (%)   MCV 88.4  78.0 - 100.0 (fL)   MCH 30.0  26.0 - 34.0 (pg)   MCHC 34.0  30.0 - 36.0 (g/dL)   RDW 78.2  95.6 - 21.3 (%)   Platelets 213  150  - 400 (K/uL)  DIFFERENTIAL      Component Value Range   Neutrophils Relative 51  43 - 77 (%)   Neutro Abs 3.4  1.7 - 7.7 (K/uL)   Lymphocytes Relative 38  12 - 46 (%)   Lymphs Abs 2.5  0.7 - 4.0 (K/uL)   Monocytes Relative 9  3 - 12 (%)   Monocytes Absolute 0.6  0.1 - 1.0 (K/uL)   Eosinophils Relative 2  0 - 5 (%)   Eosinophils Absolute 0.1  0.0 - 0.7 (K/uL)   Basophils Relative 0  0 - 1 (%)   Basophils Absolute 0.0  0.0 - 0.1 (K/uL)  COMPREHENSIVE METABOLIC PANEL      Component Value Range   Sodium 139  135 - 145 (mEq/L)   Potassium 4.5  3.5 - 5.1 (mEq/L)   Chloride 101  96 - 112 (mEq/L)   CO2 30  19 - 32 (mEq/L)   Glucose, Bld 92  70 - 99 (mg/dL)   BUN 9  6 - 23 (mg/dL)   Creatinine, Ser 0.86  0.50 - 1.10 (mg/dL)   Calcium 57.8 (*) 8.4 - 10.5 (mg/dL)   Total Protein 8.2  6.0 - 8.3 (g/dL)   Albumin 4.6  3.5 - 5.2 (g/dL)   AST 18  0 - 37 (U/L)   ALT 11  0 - 35 (U/L)   Alkaline Phosphatase 70  39 - 117 (U/L)   Total Bilirubin 0.3  0.3 - 1.2 (mg/dL)   GFR calc non Af Amer 83 (*) >90 (mL/min)   GFR calc Af Amer >90  >90 (mL/min)  LIPASE, BLOOD      Component Value Range   Lipase 58  11 - 59 (U/L)   No results found.  Pt given number for Gi for follow up.  I will try nexium and levsin.     Langston Masker, [place] 03/04/11 1557

## 2011-03-04 NOTE — ED Notes (Signed)
C/o abd pain x 1 year-worse yesterday-nausea-denies v/d,vaginal d/c and urinary s/s

## 2011-03-04 NOTE — Discharge Instructions (Signed)
Abdominal Pain Abdominal pain can be caused by many things. Your caregiver decides the seriousness of your pain by an examination and possibly blood tests and X-rays. Many cases can be observed and treated at home. Most abdominal pain is not caused by a disease and will probably improve without treatment. However, in many cases, more time must pass before a clear cause of the pain can be found. Before that point, it may not be known if you need more testing, or if hospitalization or surgery is needed. HOME CARE INSTRUCTIONS   Do not take laxatives unless directed by your caregiver.   Take pain medicine only as directed by your caregiver.   Only take over-the-counter or prescription medicines for pain, discomfort, or fever as directed by your caregiver.   Try a clear liquid diet (broth, tea, or water) for as long as directed by your caregiver. Slowly move to a bland diet as tolerated.  SEEK IMMEDIATE MEDICAL CARE IF:   The pain does not go away.   You have a fever.   You keep throwing up (vomiting).   The pain is felt only in portions of the abdomen. Pain in the right side could possibly be appendicitis. In an adult, pain in the left lower portion of the abdomen could be colitis or diverticulitis.   You pass bloody or black tarry stools.  MAKE SURE YOU:   Understand these instructions.   Will watch your condition.   Will get help right away if you are not doing well or get worse.  Document Released: 10/07/2004 Document Revised: 09/09/2010 Document Reviewed: 08/16/2007 [facility] Patient Information 2012 [place], [place].Abdominal Pain Abdominal pain can be caused by many things. Your caregiver decides the seriousness of your pain by an examination and possibly blood tests and X-rays. Many cases can be observed and treated at home. Most abdominal pain is not caused by a disease and will probably improve without treatment. However, in many cases, more time must pass before a clear cause of  the pain can be found. Before that point, it may not be known if you need more testing, or if hospitalization or surgery is needed. HOME CARE INSTRUCTIONS   Do not take laxatives unless directed by your caregiver.   Take pain medicine only as directed by your caregiver.   Only take over-the-counter or prescription medicines for pain, discomfort, or fever as directed by your caregiver.   Try a clear liquid diet (broth, tea, or water) for as long as directed by your caregiver. Slowly move to a bland diet as tolerated.  SEEK IMMEDIATE MEDICAL CARE IF:   The pain does not go away.   You have a fever.   You keep throwing up (vomiting).   The pain is felt only in portions of the abdomen. Pain in the right side could possibly be appendicitis. In an adult, pain in the left lower portion of the abdomen could be colitis or diverticulitis.   You pass bloody or black tarry stools.  MAKE SURE YOU:   Understand these instructions.   Will watch your condition.   Will get help right away if you are not doing well or get worse.  Document Released: 10/07/2004 Document Revised: 09/09/2010 Document Reviewed: 08/16/2007 [employer] Patient Information 2012 Exit[employer], LLPeptic Ulcers Ulcers are small, open craters or sores that develop in the lining of the stomach or the duodenum (the first part of the small intestine). The term peptic ulcer is used to describe both types of ulcers. There are  a number of treatments that relieve the discomfort of ulcers. In most cases ulcers do heal.  CAUSES AND COMMON FEATURES OF PEPTIC ULCERS  Peptic ulcers occur only in areas of the digestive system that come in contact with digestive juices. These juices are secreted (given off) by the stomach. They include acid and an enzyme called pepsin that breaks down proteins. Many people with duodenal ulcers have too much digestive juice spilling down from the stomach. Most people with gastric (stomach) ulcers have normal or below  normal amounts of stomach acid. Sometimes, when the mucous membrane (protective lining) of the stomach and duodenum does not protect well, this may add to the growth of ulcers. Duodenal ulcers often produces pain in a small area between the breastbone and navel. Pain varies from hunger pain to constant gnawing or burning sensations (feeling). Sometimes the pain is felt during sleep and may awaken the person in the middle of the night. Often the pain occurs two or three hours after eating, when the stomach is empty. Other common symptoms (problems) include overeating for pain relief. Eating relieves the pain of a duodenal ulcer. Gastric ulcer pain may be felt in the same place as the pain of a duodenal ulcer, or slightly higher up. There may also be sensations of feeling full, indigestion, and heartburn. Sometimes pain occurs when the stomach is full. This causes loss of appetite followed by weight loss. HOME CARE INSTRUCTIONS   Use of tobacco products have been found to slow down the healing of an ulcer. STOP SMOKING.   Avoid alcohol, aspirin, and other inflammation (swelling and soreness) reducing drugs. These substances weaken the stomach lining.   Eat regular, nutritious meals.   Avoid foods that bother you.   Take medications and antacids as directed. Over-the-counter medications are used to neutralize stomach acid. Prescription medications reduce acid secretion, block acid production, or provide a protective coating over the ulcer. If a specific antacid was prescribed, do not switch brands without your caregiver's approval.  Surgery is usually not necessary. Diet and/or drug therapy usually is effective. Surgery may be necessary if perforation, obstruction due to scarring, or uncontrollable bleeding is found, or if severe pain is not otherwise controlled. SEEK IMMEDIATE MEDICAL CARE IF:  You see signs of bleeding. This includes vomiting fresh, bright red blood or passing bloody or tarry, black  stools.   You suffer weakness, fatigue, or loss of consciousness. These symptoms can result from severe hemorrhaging (bleeding). Shock may result.   You have sudden, intense, severe abdominal (belly) pain. This is the first sign of a perforation. This would require immediate surgical treatment.   You have intense pain and continued vomiting. This could signal an obstruction of the digestive tract.  Document Released: 12/26/1999 Document Revised: 09/09/2010 Document Reviewed: 12/25/2007 [facility] Patient Information 2012 [place], [place].C.

## 2011-03-05 NOTE — ED Provider Notes (Signed)
Medical screening examination/treatment/procedure(s) were performed by non-physician practitioner and as supervising physician I was immediately available for consultation/collaboration.   Gwyneth Sprout, MD 03/05/11 [phone]

## 2012-05-18 ENCOUNTER — Encounter: Payer: Self-pay | Admitting: Family Medicine

## 2012-05-18 ENCOUNTER — Ambulatory Visit (INDEPENDENT_AMBULATORY_CARE_PROVIDER_SITE_OTHER): Payer: Managed Care, Other (non HMO) | Admitting: Family Medicine

## 2012-05-18 VITALS — BP 120/72 | Temp 98.6°F | Wt 125.0 lb

## 2012-05-18 DIAGNOSIS — B349 Viral infection, unspecified: Secondary | ICD-10-CM

## 2012-05-18 DIAGNOSIS — J029 Acute pharyngitis, unspecified: Secondary | ICD-10-CM

## 2012-05-18 DIAGNOSIS — B9789 Other viral agents as the cause of diseases classified elsewhere: Secondary | ICD-10-CM

## 2012-05-18 LAB — POCT RAPID STREP A (OFFICE): Rapid Strep A Screen: NEGATIVE

## 2012-05-18 MED ORDER — ONDANSETRON HCL 4 MG PO TABS: 4.0000 mg | ORAL_TABLET | Freq: Three times a day (TID) | ORAL | Status: DC | PRN

## 2012-05-18 NOTE — Progress Notes (Signed)
Chief Complaint  Patient presents with  . Fatigue    weakness, nausea, loose stools    HPI:  Acute visit for feeling unwell: -started: about 3-4 days ago after trip with a bunch of kids  -symptoms: nausea, diarrhea - multiple episodes (no blood or mucus), weakness, body aches, chills, sore throat -denies: fever, vomiting, blood in stool, antibiotics, foreign travel, pregnancy, dysuria, vaginal symptoms, abd or pelvic pain  ROS: See pertinent positives and negatives per HPI.  Past Medical History  Diagnosis Date  . Gestational diabetes   . Pre-eclampsia     Family History  Problem Relation Age of Onset  . Diabetes    . Hypertension    . Coronary artery disease      History   Social History  . Marital Status: Single    Spouse Name: N/A    Number of Children: N/A  . Years of Education: N/A   Social History Main Topics  . Smoking status: Former Games developer  . Smokeless tobacco: None  . Alcohol Use: No  . Drug Use: No  . Sexually Active: None   Other Topics Concern  . None   Social History Narrative  . None    Current outpatient prescriptions:esomeprazole (NEXIUM) 40 MG capsule, Take 1 capsule (40 mg total) by mouth daily., Disp: 30 capsule, Rfl: 0;  ondansetron (ZOFRAN) 4 MG tablet, Take 1 tablet (4 mg total) by mouth every 8 (eight) hours as needed for nausea., Disp: 20 tablet, Rfl: 0  EXAM:  Filed Vitals:   05/18/12 1436  BP: 120/72  Temp: 98.6 F (37 C)    Body mass index is 21.45 kg/(m^2).  GENERAL: vitals reviewed and listed above, alert, oriented, appears well hydrated and in no acute distress  HEENT: atraumatic, conjunttiva clear, no obvious abnormalities on inspection of external nose and ears, normal appearance of ear canals and TMs, clear nasal congestion, mild post oropharyngeal erythema with PND, no tonsillar edema or exudate, no sinus TTP  NECK: no obvious masses on inspection  LUNGS: clear to auscultation bilaterally, no wheezes, rales or  rhonchi, good air movement  CV: HRRR, no peripheral edema  ABD: BS+, soft, NTTP  MS: moves all extremities without noticeable abnormality  PSYCH: pleasant and cooperative, no obvious depression or anxiety  ASSESSMENT AND PLAN:  Discussed the following assessment and plan:  Viral syndrome - Plan: ondansetron (ZOFRAN) 4 MG tablet  -rapid strep neg -suspect viral syndrome, normal vital and benign exam - advised oral rehydration and supportive care and return precuations -Patient advised to return or notify a doctor immediately if symptoms worsen or persist or new concerns arise.  There are no Patient Instructions on file for this visit.   Kriste Basque R.

## 2012-05-18 NOTE — Patient Instructions (Addendum)
-  plenty of rest and fluids with electrolytes if not eating  - no dairy for 5-7 days  -zofran as instructed if needed for nausea and vomiting  -imodium (loperamide) if needed for diarrhea  -follow up if worsening or persists

## 2013-10-19 ENCOUNTER — Telehealth: Payer: Self-pay | Admitting: Family Medicine

## 2013-10-19 ENCOUNTER — Encounter: Payer: Self-pay | Admitting: Internal Medicine

## 2013-10-19 ENCOUNTER — Ambulatory Visit (INDEPENDENT_AMBULATORY_CARE_PROVIDER_SITE_OTHER): Payer: Managed Care, Other (non HMO) | Admitting: Internal Medicine

## 2013-10-19 VITALS — BP 116/80 | HR 67 | Temp 98.1°F | Wt 125.0 lb

## 2013-10-19 DIAGNOSIS — B349 Viral infection, unspecified: Secondary | ICD-10-CM

## 2013-10-19 DIAGNOSIS — R490 Dysphonia: Secondary | ICD-10-CM

## 2013-10-19 MED ORDER — HYDROCODONE-HOMATROPINE 5-1.5 MG/5ML PO SYRP: ORAL_SOLUTION | ORAL | Status: DC

## 2013-10-19 NOTE — Progress Notes (Signed)
Pre visit review using our clinic review tool, if applicable. No additional management support is needed unless otherwise documented below in the visit note. 

## 2013-10-19 NOTE — Progress Notes (Signed)
Subjective:    Patient ID: Samantha Stuart, female    DOB: Mar 11, 1975, 38 y.o.   MRN: [id number]  HPI 38 year old patient who has a two-week history of cough.  She was seen at an urgent care 6 days ago and treated with azithromycin.  She still complains of hoarseness, cough, chest congestion, and some chest pain.  Pain is described as a tightness with radiation to the back.  She describes some myalgias.  No fever, will or productive cough.  Past Medical History  Diagnosis Date  . Gestational diabetes   . Pre-eclampsia     History   Social History  . Marital Status: Single    Spouse Name: N/A    Number of Children: N/A  . Years of Education: N/A   Occupational History  . Not on file.   Social History Main Topics  . Smoking status: Former Research scientist (life sciences)  . Smokeless tobacco: Not on file  . Alcohol Use: No  . Drug Use: No  . Sexual Activity: Not on file   Other Topics Concern  . Not on file   Social History Narrative  . No narrative on file    Past Surgical History  Procedure Laterality Date  . Tubal ligation    . Cesarean section      Family History  Problem Relation Age of Onset  . Diabetes    . Hypertension    . Coronary artery disease      No Known Allergies  No current outpatient prescriptions on file prior to visit.   No current facility-administered medications on file prior to visit.    BP 116/80  Pulse 67  Temp(Src) 98.1 F (36.7 C) (Oral)  Wt 125 lb (56.7 kg)  SpO2 98%       Review of Systems  Constitutional: Positive for activity change, appetite change and fatigue.  HENT: Negative for congestion, dental problem, hearing loss, rhinorrhea, sinus pressure, sore throat and tinnitus.   Eyes: Negative for pain, discharge and visual disturbance.  Respiratory: Positive for cough. Negative for shortness of breath.   Cardiovascular: Positive for chest pain. Negative for palpitations and leg swelling.  Gastrointestinal: Negative for nausea,  vomiting, abdominal pain, diarrhea, constipation, blood in stool and abdominal distention.  Genitourinary: Negative for dysuria, urgency, frequency, hematuria, flank pain, vaginal bleeding, vaginal discharge, difficulty urinating, vaginal pain and pelvic pain.  Musculoskeletal: Negative for arthralgias, gait problem and joint swelling.  Skin: Negative for rash.  Neurological: Negative for dizziness, syncope, speech difficulty, weakness, numbness and headaches.  Hematological: Negative for adenopathy.  Psychiatric/Behavioral: Negative for behavioral problems, dysphoric mood and agitation. The patient is not nervous/anxious.        Objective:   Physical Exam  Constitutional: She is oriented to person, place, and time. She appears well-developed and well-nourished.  HENT:  Head: Normocephalic.  Right Ear: External ear normal.  Left Ear: External ear normal.  Mouth/Throat: Oropharynx is clear and moist.  Eyes: Conjunctivae and EOM are normal. Pupils are equal, round, and reactive to light.  Neck: Normal range of motion. Neck supple. No thyromegaly present.  Cardiovascular: Normal rate, regular rhythm, normal heart sounds and intact distal pulses.   Pulmonary/Chest: Effort normal and breath sounds normal. No respiratory distress. She has no wheezes. She has no rales. She exhibits no tenderness.  O2 saturation 99 Pulse rate 62  Abdominal: Soft. Bowel sounds are normal. She exhibits no mass. There is no tenderness.  Musculoskeletal: Normal range of motion.  Lymphadenopathy:  She has no cervical adenopathy.  Neurological: She is alert and oriented to person, place, and time.  Skin: Skin is warm and dry. No rash noted.  Psychiatric: She has a normal mood and affect. Her behavior is normal.          Assessment & Plan:   Viral URI with cough.  Will treat symptomatically

## 2013-10-19 NOTE — Patient Instructions (Signed)
Acute bronchitis symptoms  are generally not helped by antibiotics.  Take over-the-counter expectorants and cough medications such as  Mucinex DM.  Call if there is no improvement in 5 to 7 days or if  you develop worsening cough, fever, or new symptoms, such as shortness of breath or chest pain. 

## 2013-10-19 NOTE — Telephone Encounter (Signed)
Patient Information:  Caller Name: Rosalva  Phone: [phone]  Patient: Samantha Stuart, Samantha Stuart  Gender: Female  DOB: January 10, 1976  Age: 38 Years  PCP: Alysia Penna [facility])  Pregnant: No  Office Follow Up:  Does the office need to follow up with this patient?: No  Instructions For The Office: N/A   Symptoms  Reason For Call & Symptoms: Pt reports seen in the office 10/13/13 and diagnosed with Bronchitis and completed ZPak 10/18/13.  Pt reports having tightness in the chest through to back and SOB.  Reviewed Health History In EMR: Yes  Reviewed Medications In EMR: Yes  Reviewed Allergies In EMR: Yes  Reviewed Surgeries / Procedures: Yes  Date of Onset of Symptoms: 10/07/2013 OB / GYN:  LMP: Unknown  Guideline(s) Used:  Breathing Difficulty  Disposition Per Guideline:   Go to Office Now  Reason For Disposition Reached:   Mild difficulty breathing (e.g., minimal/no SOB at rest, SOB with walking, pulse < 100) of new onset or worse than normal  Advice Given:  Call Back If:  You become worse.  Patient Will Follow Care Advice:  YES  Appointment Scheduled:  10/19/2013 10:15:00 Appointment Scheduled Provider:  Bluford Kaufmann (Family Practice > 46yrs old)

## 2013-12-27 ENCOUNTER — Ambulatory Visit (INDEPENDENT_AMBULATORY_CARE_PROVIDER_SITE_OTHER): Payer: Managed Care, Other (non HMO) | Admitting: Family Medicine

## 2013-12-27 ENCOUNTER — Encounter: Payer: Self-pay | Admitting: Family Medicine

## 2013-12-27 VITALS — BP 113/74 | HR 68 | Temp 98.7°F | Ht 64.0 in | Wt 128.0 lb

## 2013-12-27 DIAGNOSIS — N644 Mastodynia: Secondary | ICD-10-CM

## 2013-12-27 NOTE — Progress Notes (Signed)
   Subjective:    Patient ID: Samantha Stuart, female    DOB: 12-10-1975, 38 y.o.   MRN: [id number]  HPI Here for one month of tingling and pain in the lateral left breast. She has not felt a lump per se. No swelling under the armpit. No skin changes or nipple DC. She has not had a period for years since she had an IUD placed 4 and 1/2 years ago. She sees Dr. Garwin Brothers for GYN care. She mentions 2 maternal aunts have had breast cancer.    Review of Systems  Constitutional: Negative.        Objective:   Physical Exam  Constitutional: She appears well-developed and well-nourished.  Genitourinary:  The right breast and axilla are clear. The left axilla is clear with no masses or tenderness. The left breast appears normal with no skin changes and no nipple DC. She is quite tender in the lateral breast and there is increased density noted in the tissue of the lateral breast with no specific lumps felt           Assessment & Plan:  Breast tenderness that needs to be further evaluated. She has never had a mammogram. We will set up a diagnostic mammogram and an [place] soon.

## 2014-01-17 ENCOUNTER — Ambulatory Visit
Admission: RE | Admit: 2014-01-17 | Discharge: 2014-01-17 | Disposition: A | Discharge status: Home / Self Care | Payer: Managed Care, Other (non HMO) | Source: Ambulatory Visit | Attending: Family Medicine | Admitting: Family Medicine

## 2014-01-17 DIAGNOSIS — N644 Mastodynia: Secondary | ICD-10-CM

## 2014-04-24 ENCOUNTER — Encounter: Payer: Self-pay | Admitting: Adult Health

## 2014-04-24 ENCOUNTER — Ambulatory Visit (INDEPENDENT_AMBULATORY_CARE_PROVIDER_SITE_OTHER): Payer: Managed Care, Other (non HMO) | Admitting: Adult Health

## 2014-04-24 VITALS — BP 122/80 | Temp 98.5°F | Wt 125.8 lb

## 2014-04-24 DIAGNOSIS — R509 Fever, unspecified: Secondary | ICD-10-CM

## 2014-04-24 DIAGNOSIS — B349 Viral infection, unspecified: Secondary | ICD-10-CM

## 2014-04-24 DIAGNOSIS — R110 Nausea: Secondary | ICD-10-CM | POA: Diagnosis not present

## 2014-04-24 LAB — POCT INFLUENZA A/B
Influenza A, POC: NEGATIVE
Influenza B, POC: NEGATIVE

## 2014-04-24 MED ORDER — ONDANSETRON 4 MG PO TBDP: 4.0000 mg | ORAL_TABLET | Freq: Three times a day (TID) | ORAL | Status: DC | PRN

## 2014-04-24 NOTE — Patient Instructions (Addendum)
You likely have a viral illness.  I do not think that your condition warrants a round of antibiotics at this time. Take the zofran for nausea and tylenol or ibuprofen for the body aches and headache.Continue to rest and drink plenty of fluids. You should start to feel better in the next few days. If you are not feeling better in 2-3 days, please let me know.    Viral Infections A virus is a type of germ. Viruses can cause:  Minor sore throats.  Aches and pains.  Headaches.  Runny nose.  Rashes.  Watery eyes.  Tiredness.  Coughs.  Loss of appetite.  Feeling sick to your stomach (nausea).  Throwing up (vomiting).  Watery poop (diarrhea). HOME CARE   Only take medicines as told by your doctor.  Drink enough water and fluids to keep your pee (urine) clear or pale yellow. Sports drinks are a good choice.  Get plenty of rest and eat healthy. Soups and broths with crackers or rice are fine. GET HELP RIGHT AWAY IF:   You have a very bad headache.  You have shortness of breath.  You have chest pain or neck pain.  You have an unusual rash.  You cannot stop throwing up.  You have watery poop that does not stop.  You cannot keep fluids down.  You or your child has a temperature by mouth above 102 F (38.9 C), not controlled by medicine.  Your baby is older than 3 months with a rectal temperature of 102 F (38.9 C) or higher.  Your baby is 5 months old or younger with a rectal temperature of 100.4 F (38 C) or higher. MAKE SURE YOU:   Understand these instructions.  Will watch this condition.  Will get help right away if you are not doing well or get worse. Document Released: 12/11/2007 Document Revised: 03/22/2011 Document Reviewed: 05/05/2010 [facility] Patient Information 2015 [place], [place]. This information is not intended to replace advice given to you by your health care provider. Make sure you discuss any questions you have with your health care  provider.

## 2014-04-24 NOTE — Progress Notes (Signed)
   Subjective:    Patient ID: Samantha Stuart, female    DOB: 02-06-1975, 39 y.o.   MRN: [id number]  HPI  Patient presents to the office with flu like symptoms. She endorses having a headache, generalized body aches, malaise, weakness, dizziness, sinus pain and pressure all since Sunday evening. Per patient " it hit me like a ton of bricks." She also endorses nausea and loss of appetite, but no vomiting or diarrhea. She denies any sick contacts at home and has not been at work since Friday. Has tried tylenol, ibuprofen and mucinex without relief.   Review of Systems  Constitutional: Positive for activity change, appetite change and fatigue. Negative for fever and diaphoresis.  HENT: Positive for congestion, ear pain, sinus pressure and sore throat.   Eyes: Negative for pain.  Respiratory: Positive for cough and chest tightness. Negative for shortness of breath.   Cardiovascular: Negative for chest pain, palpitations and leg swelling.  Gastrointestinal: Positive for nausea. Negative for vomiting and diarrhea.  Genitourinary: Negative.   Musculoskeletal: Negative for neck pain and neck stiffness.  Skin: Negative.   Neurological: Positive for dizziness, weakness, light-headedness and headaches.  Psychiatric/Behavioral: Negative for confusion.       Objective:   Physical Exam  Constitutional: She is oriented to person, place, and time. She appears well-developed and well-nourished. No distress.  HENT:  Head: Normocephalic and atraumatic.  Right Ear: External ear normal.  Left Ear: External ear normal.  Mouth/Throat: Oropharynx is clear and moist. No oropharyngeal exudate.  Eyes: Pupils are equal, round, and reactive to light. Right eye exhibits no discharge. Left eye exhibits no discharge.  Neck:  Frontal, ethmoid and left subclavian  Cardiovascular: Normal rate, regular rhythm and normal heart sounds.  Exam reveals no gallop and no friction rub.   No murmur heard. Pulmonary/Chest:  Effort normal and breath sounds normal. No respiratory distress. She has no wheezes.  Abdominal: Soft. Bowel sounds are normal. She exhibits no distension. There is no tenderness.  Musculoskeletal: Normal range of motion.  Lymphadenopathy:    She has cervical adenopathy.  Neurological: She is alert and oriented to person, place, and time. No cranial nerve deficit. Coordination normal.  Skin: Skin is warm and dry. No rash noted. She is not diaphoretic. No erythema.  Psychiatric: She has a normal mood and affect. Her behavior is normal. Thought content normal.  Nursing note and vitals reviewed.      Assessment & Plan:  Viral Illness - Symptoms sound like a likely viral illness - Prescribed zofran ODT 4 mg Q 8H PRN for nausea - Can take Ibuprofen or Tylenol for symptom relief. - Advised to drink plenty of fluids, get plenty of sleep and follow up if not feeling better in 2-3 days.

## 2014-04-24 NOTE — Progress Notes (Signed)
Pre visit review using our clinic review tool, if applicable. No additional management support is needed unless otherwise documented below in the visit note. 

## 2014-04-26 ENCOUNTER — Encounter (HOSPITAL_COMMUNITY): Payer: Self-pay

## 2014-04-26 ENCOUNTER — Emergency Department (HOSPITAL_COMMUNITY): Payer: Managed Care, Other (non HMO)

## 2014-04-26 ENCOUNTER — Emergency Department (HOSPITAL_COMMUNITY)
Admission: EM | Admit: 2014-04-26 | Discharge: 2014-04-26 | Disposition: A | Discharge status: Home / Self Care | Payer: Managed Care, Other (non HMO) | Attending: Emergency Medicine | Admitting: Emergency Medicine

## 2014-04-26 DIAGNOSIS — Z8632 Personal history of gestational diabetes: Secondary | ICD-10-CM | POA: Diagnosis not present

## 2014-04-26 DIAGNOSIS — N390 Urinary tract infection, site not specified: Secondary | ICD-10-CM | POA: Diagnosis not present

## 2014-04-26 DIAGNOSIS — R51 Headache: Secondary | ICD-10-CM

## 2014-04-26 DIAGNOSIS — Z3202 Encounter for pregnancy test, result negative: Secondary | ICD-10-CM | POA: Insufficient documentation

## 2014-04-26 DIAGNOSIS — Z87891 Personal history of nicotine dependence: Secondary | ICD-10-CM | POA: Diagnosis not present

## 2014-04-26 DIAGNOSIS — J3489 Other specified disorders of nose and nasal sinuses: Secondary | ICD-10-CM | POA: Diagnosis not present

## 2014-04-26 DIAGNOSIS — R519 Headache, unspecified: Secondary | ICD-10-CM

## 2014-04-26 DIAGNOSIS — R05 Cough: Secondary | ICD-10-CM | POA: Insufficient documentation

## 2014-04-26 DIAGNOSIS — R059 Cough, unspecified: Secondary | ICD-10-CM

## 2014-04-26 LAB — I-STAT CHEM 8, ED
BUN: 11 mg/dL (ref 6–23)
Calcium, Ion: 1.16 mmol/L (ref 1.12–1.23)
Chloride: 101 mmol/L (ref 96–112)
Creatinine, Ser: 0.9 mg/dL (ref 0.50–1.10)
Glucose, Bld: 98 mg/dL (ref 70–99)
HEMATOCRIT: 48 % — AB (ref 36.0–46.0)
Hemoglobin: 16.3 g/dL — ABNORMAL HIGH (ref 12.0–15.0)
POTASSIUM: 4.2 mmol/L (ref 3.5–5.1)
SODIUM: 138 mmol/L (ref 135–145)
TCO2: 24 mmol/L (ref 0–100)

## 2014-04-26 LAB — URINALYSIS, ROUTINE W REFLEX MICROSCOPIC
Bilirubin Urine: NEGATIVE
Glucose, UA: NEGATIVE mg/dL
Ketones, ur: NEGATIVE mg/dL
Nitrite: NEGATIVE
PROTEIN: NEGATIVE mg/dL
SPECIFIC GRAVITY, URINE: 1.006 (ref 1.005–1.030)
Urobilinogen, UA: 0.2 mg/dL (ref 0.0–1.0)
pH: 6 (ref 5.0–8.0)

## 2014-04-26 LAB — URINE MICROSCOPIC-ADD ON

## 2014-04-26 LAB — I-STAT BETA HCG BLOOD, ED (MC, WL, AP ONLY): I-stat hCG, quantitative: <5 m[IU]/mL (ref <5)

## 2014-04-26 MED ORDER — SULFAMETHOXAZOLE-TRIMETHOPRIM 800-160 MG PO TABS
1.0000 | ORAL_TABLET | Freq: Once | ORAL | Status: AC
  Administered 2014-04-26: 1 via ORAL
  Filled 2014-04-26: qty 1

## 2014-04-26 MED ORDER — PROCHLORPERAZINE EDISYLATE 5 MG/ML IJ SOLN
10.0000 mg | Freq: Four times a day (QID) | INTRAMUSCULAR | Status: DC | PRN
  Administered 2014-04-26: 10 mg via INTRAVENOUS
  Filled 2014-04-26: qty 2

## 2014-04-26 MED ORDER — MAGNESIUM SULFATE 2 GM/50ML IV SOLN
2.0000 g | Freq: Once | INTRAVENOUS | Status: AC
  Administered 2014-04-26: 2 g via INTRAVENOUS
  Filled 2014-04-26: qty 50

## 2014-04-26 MED ORDER — SODIUM CHLORIDE 0.9 % IV BOLUS (SEPSIS)
500.0000 mL | Freq: Once | INTRAVENOUS | Status: AC
  Administered 2014-04-26: 500 mL via INTRAVENOUS

## 2014-04-26 MED ORDER — METHYLPREDNISOLONE SODIUM SUCC 125 MG IJ SOLR
125.0000 mg | Freq: Once | INTRAMUSCULAR | Status: AC
Start: 2014-04-26 — End: 2014-04-26
  Administered 2014-04-26: 125 mg via INTRAVENOUS
  Filled 2014-04-26: qty 2

## 2014-04-26 MED ORDER — DIPHENHYDRAMINE HCL 50 MG/ML IJ SOLN
25.0000 mg | Freq: Once | INTRAMUSCULAR | Status: AC
Start: 2014-04-26 — End: 2014-04-26
  Administered 2014-04-26: 25 mg via INTRAVENOUS
  Filled 2014-04-26: qty 1

## 2014-04-26 MED ORDER — SODIUM CHLORIDE 0.9 % IV BOLUS (SEPSIS)
1000.0000 mL | Freq: Once | INTRAVENOUS | Status: AC
  Administered 2014-04-26: 1000 mL via INTRAVENOUS

## 2014-04-26 MED ORDER — SULFAMETHOXAZOLE-TRIMETHOPRIM 800-160 MG PO TABS: 1.0000 | ORAL_TABLET | Freq: Two times a day (BID) | ORAL | Status: DC

## 2014-04-26 NOTE — ED Provider Notes (Signed)
CSN: [id number]     Arrival date & time 04/26/14  1056 History   First MD Initiated Contact with Patient 04/26/14 1112     Chief Complaint  Patient presents with  . Headache     (Consider location/radiation/quality/duration/timing/severity/associated sxs/prior Treatment) HPI   Samantha Stuart is a 39 y.o. female who is otherwise healthy complaining of severe generalized fatigue, decreased by mouth intake, diffuse myalgia, frontal headache (8 out of 10, described as throbbing, no exacerbating or alleviating factors identified), clear rhinorrhea, dry cough onset 5 days ago. She denies fevers, chills, nausea, vomiting, sick contacts, cervicalgia, rash, change in vision, headache exacerbation with exertion, thunderclap onset of headache, tick bite, rash, recent travel. On review of systems. patient notes that she has intermittent chest pain and shortness of breath,  single episode of loose stool this morning, urinary frequency without dysuria or hematuria.. She denies calf pain or leg swelling, history of DVT or PE, hemoptysis, melena, hematochezia, focal abdominal pain. Patient has not had a flu shot this year.    Past Medical History  Diagnosis Date  . Gestational diabetes   . Pre-eclampsia    Past Surgical History  Procedure Laterality Date  . Tubal ligation    . Cesarean section     Family History  Problem Relation Age of Onset  . Diabetes    . Hypertension    . Coronary artery disease     History  Substance Use Topics  . Smoking status: Former Research scientist (life sciences)  . Smokeless tobacco: Never Used  . Alcohol Use: No   OB History    No data available     Review of Systems  10 systems reviewed and found to be negative, except as noted in the HPI.   Allergies  Review of patient's allergies indicates no known allergies.  Home Medications   Prior to Admission medications   Medication Sig Start Date End Date Taking? Authorizing Provider  acetaminophen (TYLENOL) 500 MG tablet Take  500 mg by mouth every 6 (six) hours as needed.   Yes Historical Provider, MD  sulfamethoxazole-trimethoprim (BACTRIM DS,SEPTRA DS) 800-160 MG per tablet Take 1 tablet by mouth 2 (two) times daily. 04/26/14 05/03/14  Leticia Mcdiarmid, PA-C   BP 99/61 mmHg  Pulse 58  Temp(Src) 98 F (36.7 C) (Oral)  Ht 5\' 4"  (1.626 m)  Wt 124 lb 1.6 oz (56.291 kg)  BMI 21.29 kg/m2  SpO2 100% Physical Exam  Constitutional: She is oriented to person, place, and time. She appears well-developed and well-nourished. No distress.  HENT:  Head: Normocephalic and atraumatic.  Mouth/Throat: Oropharynx is clear and moist.  No drooling or stridor. Posterior pharynx mildly erythematous no significant tonsillar hypertrophy. No exudate. Soft palate rises symmetrically. No TTP or induration under tongue.   ++ tenderness to palpation of frontal or bilateral maxillary sinuses.  No mucosal edema in the nares.  Bilateral tympanic membranes with normal architecture and good light reflex.    Eyes: Conjunctivae and EOM are normal. Pupils are equal, round, and reactive to light.  Neck: Normal range of motion. Neck supple.  Cardiovascular: Normal rate, regular rhythm and intact distal pulses.   Pulmonary/Chest: Effort normal and breath sounds normal. No stridor. No respiratory distress. She has no wheezes. She has no rales. She exhibits no tenderness.  Abdominal: Soft. Bowel sounds are normal. She exhibits no distension and no mass. There is no tenderness. There is no rebound and no guarding.  Musculoskeletal: Normal range of motion.  No calf asymmetry, superficial  collaterals, palpable cords, edema, Homans sign negative bilaterally.    Neurological: She is alert and oriented to person, place, and time.  Psychiatric: She has a normal mood and affect.  Nursing note and vitals reviewed.   ED Course  Procedures (including critical care time) Labs Review Labs Reviewed  URINALYSIS, ROUTINE W REFLEX MICROSCOPIC - Abnormal;  Notable for the following:    Hgb urine dipstick MODERATE (*)    Leukocytes, UA TRACE (*)    All other components within normal limits  URINE MICROSCOPIC-ADD ON - Abnormal; Notable for the following:    Squamous Epithelial / LPF FEW (*)    Bacteria, UA FEW (*)    All other components within normal limits  I-STAT CHEM 8, ED - Abnormal; Notable for the following:    Hemoglobin 16.3 (*)    HCT 48.0 (*)    All other components within normal limits  I-STAT BETA HCG BLOOD, ED ([facility], [facility], [facility] ONLY)    Imaging Review Dg Chest 2 View  04/26/2014   CLINICAL DATA:  Weakness, body aches  EXAM: CHEST  2 VIEW  COMPARISON:  04/21/2006  FINDINGS: Lungs are clear.  No pleural effusion or pneumothorax.  The heart is normal in size.  Visualized osseous structures are within normal limits.  IMPRESSION: Normal chest radiographs.   Electronically Signed   By: Julian Hy M.D.   On: 04/26/2014 13:29     EKG Interpretation None      MDM   Final diagnoses:  Cough  Sinus headache  UTI (lower urinary tract infection)    Filed Vitals:   04/26/14 1400 04/26/14 1415 04/26/14 1430 04/26/14 1458  BP: 101/65  99/61   Pulse: 62 61 58   Temp:    98 F (36.7 C)  TempSrc:    Oral  Height:      Weight:      SpO2: 99% 100% 100%     Medications  prochlorperazine (COMPAZINE) injection 10 mg (10 mg Intravenous Given 04/26/14 1346)  sodium chloride 0.9 % bolus 500 mL (0 mLs Intravenous Stopped 04/26/14 1345)  magnesium sulfate IVPB 2 g 50 mL (0 g Intravenous Stopped 04/26/14 1345)  methylPREDNISolone sodium succinate (SOLU-MEDROL) 125 mg/2 mL injection 125 mg (125 mg Intravenous Given 04/26/14 1211)  diphenhydrAMINE (BENADRYL) injection 25 mg (25 mg Intravenous Given 04/26/14 1208)  sodium chloride 0.9 % bolus 1,000 mL (0 mLs Intravenous Stopped 04/26/14 1442)  sulfamethoxazole-trimethoprim (BACTRIM DS,SEPTRA DS) 800-160 MG per tablet 1 tablet (1 tablet Oral Given 04/26/14 1441)    Samantha Stuart Samantha Stuart is a  pleasant 39 y.o. female presenting with frontal headache consistent with sinusitis, patient also reports fatigue, she is afebrile, appears well-hydrated. She reports urinary frequency with no dysuria patient has a moderate amount of hemoglobin with trace leukocytes 36 white blood cells and a few bacteria. Will treat for UTI with Bactrim. I've encouraged her to obtain Flonase and decongestant.  Evaluation does not show pathology that would require ongoing emergent intervention or inpatient treatment. Pt is hemodynamically stable and mentating appropriately. Discussed findings and plan with patient/guardian, who agrees with care plan. All questions answered. Return precautions discussed and outpatient follow up given.   Discharge Medication List as of 04/26/2014  2:54 PM    START taking these medications   Details  sulfamethoxazole-trimethoprim (BACTRIM DS,SEPTRA DS) 800-160 MG per tablet Take 1 tablet by mouth 2 (two) times daily., Starting 04/26/2014, Until Fri 05/03/14, Print  Monico Blitz, PA-C 04/26/14 [place], DO 04/27/14 [phone]

## 2014-04-26 NOTE — ED Notes (Signed)
Pt presents with 1 week h/o frontal headache.  Pt taking OTC meds that help "a little".  Pt endorses generalized weakness, dizziness, nausea and no appetite.  Pt reports onset of loose stools this morning, denies abdominal  Pain.

## 2014-04-26 NOTE — Discharge Instructions (Signed)
Use nasal saline (you can try Arm and Hammer Simply Saline) at least 4 times a day, use saline 5-10 minutes before using the fluticasone (flonase) nasal spray  Do not use Afrin (Oxymetazoline)  Rest, wash hands frequently  and drink plenty of water.  You may try counter medication such as Mucinex or Claritin decongestant.   Push fluids: take small frequent sips of water or Gatorade, do not drink any soda, juice or caffeinated beverages.

## 2014-04-29 ENCOUNTER — Encounter: Payer: Self-pay | Admitting: Adult Health

## 2014-04-29 ENCOUNTER — Ambulatory Visit (INDEPENDENT_AMBULATORY_CARE_PROVIDER_SITE_OTHER): Payer: Managed Care, Other (non HMO) | Admitting: Adult Health

## 2014-04-29 VITALS — BP 98/70 | Temp 98.4°F | Wt 124.0 lb

## 2014-04-29 DIAGNOSIS — R6889 Other general symptoms and signs: Secondary | ICD-10-CM

## 2014-04-29 DIAGNOSIS — R5383 Other fatigue: Secondary | ICD-10-CM | POA: Diagnosis not present

## 2014-04-29 DIAGNOSIS — R350 Frequency of micturition: Secondary | ICD-10-CM | POA: Diagnosis not present

## 2014-04-29 LAB — POCT URINALYSIS DIPSTICK
BILIRUBIN UA: NEGATIVE
Glucose, UA: NEGATIVE
Ketones, UA: NEGATIVE
Leukocytes, UA: NEGATIVE
NITRITE UA: NEGATIVE
Protein, UA: NEGATIVE
Spec Grav, UA: <=1.005
UROBILINOGEN UA: 0.2
pH, UA: 6.5

## 2014-04-29 LAB — POCT MONO (EPSTEIN BARR VIRUS): MONO, POC: NEGATIVE

## 2014-04-29 MED ORDER — PROMETHAZINE HCL 25 MG PO TABS: 25.0000 mg | ORAL_TABLET | Freq: Three times a day (TID) | ORAL | Status: DC | PRN

## 2014-04-29 MED ORDER — AMOXICILLIN-POT CLAVULANATE 875-125 MG PO TABS: 1.0000 | ORAL_TABLET | Freq: Two times a day (BID) | ORAL | Status: DC

## 2014-04-29 NOTE — Patient Instructions (Signed)
I sent a prescription to your pharmacy for Augmentin. Take it twice a day for 7 days. If you are not feeling any better in two days, please call me. Take the Phenergan as needed for nausea.

## 2014-04-29 NOTE — Progress Notes (Addendum)
Subjective:    Patient ID: Samantha Stuart, female    DOB: 1975-01-24, 39 y.o.   MRN: [id number]  HPI Patient presents back to the office after being seen on 04/24/2014 for flu like symptoms. She went to the ER on Friday and was given a fluids. She endorses feeling better after the fluids but on Saturday when she woke up her symptoms were back. She continues to feel weak, have body aches and sinus pressure with a headache. Her stomach " aches". Denies N/V/D.    Review of Systems  Constitutional: Positive for activity change and fatigue. Negative for fever, chills and unexpected weight change.  HENT: Positive for congestion, sinus pressure and sore throat. Negative for ear discharge, ear pain and facial swelling.   Respiratory: Negative for cough, chest tightness, shortness of breath and wheezing.   Cardiovascular: Negative.   Gastrointestinal: Positive for abdominal pain. Negative for nausea, vomiting, diarrhea, constipation, blood in stool and rectal pain.  Endocrine: Negative.   Genitourinary: Positive for flank pain.  Musculoskeletal: Positive for myalgias. Negative for back pain, joint swelling, arthralgias, gait problem, neck pain and neck stiffness.  Skin: Negative.   Allergic/Immunologic: Negative.   Neurological: Negative.   Hematological: Negative.   Psychiatric/Behavioral: Negative.    Past Medical History  Diagnosis Date  . Gestational diabetes   . Pre-eclampsia     History   Social History  . Marital Status: Single    Spouse Name: N/A  . Number of Children: N/A  . Years of Education: N/A   Occupational History  . Not on file.   Social History Main Topics  . Smoking status: Former Research scientist (life sciences)  . Smokeless tobacco: Never Used  . Alcohol Use: No  . Drug Use: No  . Sexual Activity: Not on file   Other Topics Concern  . Not on file   Social History Narrative    Past Surgical History  Procedure Laterality Date  . Tubal ligation    . Cesarean section       Family History  Problem Relation Age of Onset  . Diabetes    . Hypertension    . Coronary artery disease      No Known Allergies  Current Outpatient Prescriptions on File Prior to Visit  Medication Sig Dispense Refill  . acetaminophen (TYLENOL) 500 MG tablet Take 500 mg by mouth every 6 (six) hours as needed.    . sulfamethoxazole-trimethoprim (BACTRIM DS,SEPTRA DS) 800-160 MG per tablet Take 1 tablet by mouth 2 (two) times daily. (Patient not taking: Reported on 04/29/2014) 10 tablet 0   No current facility-administered medications on file prior to visit.    BP 98/70 mmHg  Temp(Src) 98.4 F (36.9 C) (Oral)  Wt 124 lb (56.246 kg)       Objective:   Physical Exam  Constitutional: She is oriented to person, place, and time. She appears well-developed and well-nourished. She appears distressed (Tired ).  HENT:  Head: Normocephalic and atraumatic.  Right Ear: External ear normal.  Left Ear: External ear normal.  Eyes: Right eye exhibits no discharge. Left eye exhibits no discharge.  Neck: Normal range of motion. Neck supple. No thyromegaly present.  Cardiovascular: Normal rate, regular rhythm, normal heart sounds and intact distal pulses.   Pulmonary/Chest: Effort normal and breath sounds normal. No respiratory distress. She has no wheezes. She has no rales. She exhibits no tenderness.  Abdominal: Soft. Bowel sounds are normal. She exhibits no distension and no mass. There is no tenderness. There  is no rebound and no guarding.  Lymphadenopathy:    She has cervical adenopathy.  Neurological: She is alert and oriented to person, place, and time.  Skin: Skin is warm and dry. No rash noted. No erythema. No pallor.  Psychiatric: She has a normal mood and affect. Her behavior is normal. Judgment and thought content normal.  Vitals reviewed.      Assessment & Plan:  1. Urinary frequency -UA is negative. She does not need to take antibiotics prescribed by the ER.    2. Flu  Like Symptoms - Mono spot negative - She continues to feel fatigued and weak with sinus pain with headache. Will prescribe Augmentin to cover for bacterial infection.  - Prescribed phenergan 25mg  Q 8 H PRN - Continue to rest and stay well hydrated.  - Follow up if no improvement in 2 days.

## 2014-04-29 NOTE — Addendum Note (Signed)
Addended by: Apolinar Junes on: 04/29/2014 04:37 PM   Modules accepted: Orders, Medications

## 2014-04-29 NOTE — Progress Notes (Signed)
Pre visit review using our clinic review tool, if applicable. No additional management support is needed unless otherwise documented below in the visit note. 

## 2014-05-01 ENCOUNTER — Ambulatory Visit (INDEPENDENT_AMBULATORY_CARE_PROVIDER_SITE_OTHER): Payer: Managed Care, Other (non HMO) | Admitting: Adult Health

## 2014-05-01 ENCOUNTER — Telehealth: Payer: Self-pay | Admitting: Family Medicine

## 2014-05-01 ENCOUNTER — Encounter: Payer: Self-pay | Admitting: Adult Health

## 2014-05-01 VITALS — BP 100/70 | Temp 98.2°F | Wt 128.0 lb

## 2014-05-01 DIAGNOSIS — T7840XA Allergy, unspecified, initial encounter: Secondary | ICD-10-CM | POA: Diagnosis not present

## 2014-05-01 DIAGNOSIS — R5381 Other malaise: Secondary | ICD-10-CM | POA: Diagnosis not present

## 2014-05-01 DIAGNOSIS — R5383 Other fatigue: Secondary | ICD-10-CM

## 2014-05-01 LAB — CBC
HEMATOCRIT: 43.5 % (ref 36.0–46.0)
HEMOGLOBIN: 14.6 g/dL (ref 12.0–15.0)
MCHC: 33.7 g/dL (ref 30.0–36.0)
MCV: 89.7 fl (ref 78.0–100.0)
Platelets: 246 10*3/uL (ref 150.0–400.0)
RBC: 4.85 Mil/uL (ref 3.87–5.11)
RDW: 13.5 % (ref 11.5–15.5)
WBC: 6.3 10*3/uL (ref 4.0–10.5)

## 2014-05-01 LAB — URINALYSIS, ROUTINE W REFLEX MICROSCOPIC
Bilirubin Urine: NEGATIVE
Hgb urine dipstick: NEGATIVE
KETONES UR: NEGATIVE
Leukocytes, UA: NEGATIVE
NITRITE: NEGATIVE
PH: 6.5 (ref 5.0–8.0)
RBC / HPF: NONE SEEN (ref 0–?)
Specific Gravity, Urine: 1.01 (ref 1.000–1.030)
Total Protein, Urine: NEGATIVE
URINE GLUCOSE: NEGATIVE
Urobilinogen, UA: 0.2 (ref 0.0–1.0)

## 2014-05-01 LAB — BASIC METABOLIC PANEL
BUN: 8 mg/dL (ref 6–23)
CALCIUM: 10.1 mg/dL (ref 8.4–10.5)
CO2: 34 meq/L — AB (ref 19–32)
Chloride: 99 mEq/L (ref 96–112)
Creatinine, Ser: 0.82 mg/dL (ref 0.40–1.20)
GFR: 101.16 mL/min (ref >60.00)
Glucose, Bld: 87 mg/dL (ref 70–99)
POTASSIUM: 3.7 meq/L (ref 3.5–5.1)
SODIUM: 136 meq/L (ref 135–145)

## 2014-05-01 MED ORDER — METHYLPREDNISOLONE ACETATE 80 MG/ML IJ SUSP
80.0000 mg | Freq: Once | INTRAMUSCULAR | Status: AC
  Administered 2014-05-01: 80 mg via INTRAMUSCULAR

## 2014-05-01 MED ORDER — PREDNISONE 20 MG PO TABS: 20.0000 mg | ORAL_TABLET | Freq: Every day | ORAL | Status: DC

## 2014-05-01 NOTE — Progress Notes (Signed)
Pre visit review using our clinic review tool, if applicable. No additional management support is needed unless otherwise documented below in the visit note. 

## 2014-05-01 NOTE — Progress Notes (Addendum)
   Subjective:    Patient ID: Samantha Stuart, female    DOB: [birth year], 39 y.o.   MRN: [id number]  HPI  Patient returns to the office after possible allergic reaction to Augmentin. She had taken four doses when this morning she woke up and felt like her throat was itchy and it was closing. She also endorses having " hives on my face." She is not feeling any better and continues to have malaise and body aches. Denies fever, chest pain, rash other than on face, nausea, vomiting, diarrhea.    Review of Systems  Constitutional: Positive for activity change, appetite change and fatigue. Negative for fever, chills and diaphoresis.  Eyes: Negative.   Respiratory: Negative.   Cardiovascular: Negative.   Gastrointestinal: Negative.   Musculoskeletal: Positive for myalgias and arthralgias. Negative for back pain, joint swelling, gait problem, neck pain and neck stiffness.  Skin: Positive for rash. Negative for pallor and wound.  Neurological: Positive for dizziness, weakness and headaches. Negative for tremors, syncope and speech difficulty.  Hematological: Positive for adenopathy. Does not bruise/bleed easily.  Psychiatric/Behavioral: Negative.        Objective:   Physical Exam  Constitutional: She is oriented to person, place, and time. She appears well-developed and well-nourished. She appears distressed (exhausted).  HENT:  Head: Normocephalic and atraumatic.  Right Ear: External ear normal.  Mouth/Throat: No oropharyngeal exudate.  Eyes: Conjunctivae are normal. Pupils are equal, round, and reactive to light. Right eye exhibits no discharge. Left eye exhibits no discharge.  Neck: Normal range of motion. No tracheal deviation present. No thyromegaly present.  Cardiovascular: Normal rate, regular rhythm and normal heart sounds.  Exam reveals no friction rub.   No murmur heard. Pulmonary/Chest: Effort normal and breath sounds normal. No respiratory distress. She has no wheezes. She has  no rales. She exhibits no tenderness.  Abdominal: Soft. Bowel sounds are normal. She exhibits no distension and no mass. There is tenderness. There is no rebound and no guarding.  Tenderness over spleen with deep palpation  Musculoskeletal: Normal range of motion. She exhibits tenderness (tenderness in finger joints and knee joints with palpation). She exhibits no edema.  Lymphadenopathy:    She has cervical adenopathy.  Neurological: She is alert and oriented to person, place, and time.  Skin: Skin is warm and dry. Rash (slight hive like rash on face. It appears as though it has cleared since this morning. ) noted. No erythema. No pallor.  Psychiatric: She has a normal mood and affect. Her behavior is normal. Judgment and thought content normal.       Assessment & Plan:  1. Allergic reaction, initial encounter - methylPREDNISolone acetate (DEPO-MEDROL) injection 80 mg; Inject 1 mL (80 mg total) into the muscle once. - Discontinue Augmentin - Go to the ER if she has any difficulty with breathing or feels as though her throat is closing  2. Malaise and fatigue - Unsure of what the cause of her generalized malaise is at this time. I still have suspicion of viral illness.  - B. burgdorfi Antibody - CBC - Basic metabolic panel - EBV ab to viral capsid ag pnl, IgG+IgM - Rocky mtn spotted fvr abs pnl(IgG+IgM) - predniSONE (DELTASONE) 20 MG tablet; Take 1 tablet (20 mg total) by mouth daily with breakfast.  Dispense: 11 tablet; Refill: 0 - Urinalysis, Routine w reflex microscopic - Will update patient on results of lab tests once they are received.  - Follow up as needed.

## 2014-05-01 NOTE — Telephone Encounter (Signed)
Has hives on her face after taking amoxicillin-clavulanate (AUGMENTIN) 875-125 MG per tablet.  Patient would like a callback.

## 2014-05-01 NOTE — Telephone Encounter (Signed)
Called and spoke with pt and pt states she has hives all over her body.  Last night while sleeping her throat felt like it was closing up and this morning she noticed hives on her face.  Pt had 4 tablets and has had amoxicillin before and never had this. Pt states her "head is killing her" and feels heavy and her chest hurts/chest pain and SOB.  While taking she has to take a minute and cannot talk very long.  Augmentin was added to pt's allergy list. Pls advise.

## 2014-05-02 ENCOUNTER — Telehealth: Payer: Self-pay | Admitting: Adult Health

## 2014-05-02 LAB — EBV AB TO VIRAL CAPSID AG PNL, IGG+IGM
EBV VCA IGG: 219 U/mL — AB (ref <18.0)
EBV VCA IgM: 11.2 U/mL (ref <36.0)

## 2014-05-02 LAB — ROCKY MTN SPOTTED FVR ABS PNL(IGG+IGM)
RMSF IgG: 0.06 IV
RMSF IgM: 0.53 IV

## 2014-05-02 LAB — B. BURGDORFI ANTIBODIES: B burgdorferi Ab IgG+IgM: 0.51 {ISR}

## 2014-05-02 NOTE — Telephone Encounter (Signed)
Informed Samantha Stuart that her lab tests for RMSF and Lyme both came back negative. Her mono test showed antibodies from a possible mono infection four years ago. Her antibodies that would be made right away were not detected. This does not mean that she has mono nor does it mean that she does not have mono. Her symptoms are consisitant with a mono infection or it may be another virus that has the same symptoms of mono, we cannot test for that. She was advised that the next step would be to follow up with Infectous Disease if she does not feel any better. She endorses feeling a little bit better currently.

## 2015-02-06 ENCOUNTER — Encounter (HOSPITAL_COMMUNITY): Payer: Self-pay | Admitting: Emergency Medicine

## 2015-02-06 ENCOUNTER — Emergency Department (INDEPENDENT_AMBULATORY_CARE_PROVIDER_SITE_OTHER)
Admission: EM | Admit: 2015-02-06 | Discharge: 2015-02-06 | Disposition: A | Discharge status: Home / Self Care | Payer: Managed Care, Other (non HMO) | Source: Home / Self Care | Attending: Family Medicine | Admitting: Family Medicine

## 2015-02-06 DIAGNOSIS — W57XXXA Bitten or stung by nonvenomous insect and other nonvenomous arthropods, initial encounter: Secondary | ICD-10-CM | POA: Diagnosis not present

## 2015-02-06 DIAGNOSIS — T148 Other injury of unspecified body region: Secondary | ICD-10-CM

## 2015-02-06 MED ORDER — TRIAMCINOLONE ACETONIDE 0.1 % EX CREA: 1.0000 "application " | TOPICAL_CREAM | Freq: Two times a day (BID) | CUTANEOUS | Status: DC

## 2015-02-06 NOTE — ED Provider Notes (Signed)
CSN: [id number]     Arrival date & time 02/06/15  1514 History   First MD Initiated Contact with Patient 02/06/15 1703     Chief Complaint  Patient presents with  . Insect Bite   (Consider location/radiation/quality/duration/timing/severity/associated sxs/prior Treatment) HPI Comments: 40 year old female presents with a line of papules to the right posterior neck. She noticed them 2 days ago. Associated with minor itching. Patient is concerned because there is minor swelling over the right paracervical trapezius area. This area is also tender and there is a small isolated posterior cervical node. No systemic symptoms. No fever. No intraoral swelling or peripheral edema. No cough or shortness of breath. No other rash. Her children do not have similar lesions.   Past Medical History  Diagnosis Date  . Gestational diabetes   . Pre-eclampsia    Past Surgical History  Procedure Laterality Date  . Tubal ligation    . Cesarean section     Family History  Problem Relation Age of Onset  . Diabetes    . Hypertension    . Coronary artery disease     Social History  Substance Use Topics  . Smoking status: Former Research scientist (life sciences)  . Smokeless tobacco: Never Used  . Alcohol Use: No   OB History    No data available     Review of Systems  Constitutional: Negative for fever, activity change and fatigue.  HENT: Negative.   Eyes: Negative.   Respiratory: Negative for cough, shortness of breath and wheezing.   Cardiovascular: Negative.   Skin:       As per history of present illness  Neurological: Negative.   Psychiatric/Behavioral: Negative.     Allergies  Augmentin  Home Medications   Prior to Admission medications   Medication Sig Start Date End Date Taking? Authorizing Provider  DiphenhydrAMINE HCl (BENADRYL ALLERGY PO) Take by mouth.   Yes Historical Provider, MD  OVER THE COUNTER MEDICATION Anti itch cream   Yes Historical Provider, MD  predniSONE (DELTASONE) 20 MG tablet Take 1  tablet (20 mg total) by mouth daily with breakfast. 05/01/14   Dorothyann Peng, NP  promethazine (PHENERGAN) 25 MG tablet Take 1 tablet (25 mg total) by mouth every 8 (eight) hours as needed for nausea or vomiting. 04/29/14   Dorothyann Peng, NP  triamcinolone cream (KENALOG) 0.1 % Apply 1 application topically 2 (two) times daily. 02/06/15   Janne Napoleon, NP   Meds Ordered and Administered this Visit  Medications - No data to display  BP 120/78 mmHg  Pulse 58  Temp(Src) 97.6 F (36.4 C) (Oral)  Resp 12  SpO2 99% No data found.   Physical Exam  Constitutional: She is oriented to person, place, and time. She appears well-developed and well-nourished. No distress.  HENT:  Head:    Mouth/Throat: Oropharynx is clear and moist. No oropharyngeal exudate.  Bilateral TMs are normal. Oropharynx is clear. No exudates. No intraoral edema or erythema. Airway widely patent. Swallowing reflex is normal.  Eyes: Conjunctivae and EOM are normal.  Neck: Normal range of motion. Neck supple.  Solitary right posterior cervical nodes less than 1 cm. Positive for minor tenderness. Minor swelling to the at the junction of the right lateral neck and shoulder, above the clavicle.  Cardiovascular: Normal rate, regular rhythm and normal heart sounds.   Pulmonary/Chest: Effort normal and breath sounds normal. No respiratory distress.  Musculoskeletal: Normal range of motion.  Lymphadenopathy:    She has cervical adenopathy.  Neurological: She is alert and  oriented to person, place, and time. No cranial nerve deficit. She exhibits normal muscle tone.  Skin: Skin is warm and dry. She is not diaphoretic. No erythema.  There is a curvilinear pattern of 6 isolated papules consistent with insect bite. This is located to the right posterior lateral neck and trapezius muscle.  Psychiatric: She has a normal mood and affect.  Nursing note and vitals reviewed.   ED Course  Procedures (including critical care time)  Labs  Review Labs Reviewed - No data to display  Imaging Review No results found.   Visual Acuity Review  Right Eye Distance:   Left Eye Distance:   Bilateral Distance:    Right Eye Near:   Left Eye Near:    Bilateral Near:         MDM   1. Insect bites    The parents is mostly consistent with an insect bites. No systemic symptoms. Triamcinolone cream twice a day as directed. He also had Benadryl cream or gel. May try cold compresses initially if that does not seem to help switch to warm compresses. For worsening may return.    Janne Napoleon, NP 02/06/15 1749

## 2015-02-06 NOTE — ED Notes (Signed)
4 bumps to back of neck and across shoulder.  The bumps/bites noticed Monday and since soreness has progressed on right side of neck and across top of right shoulder.

## 2015-10-23 ENCOUNTER — Ambulatory Visit: Payer: Managed Care, Other (non HMO) | Admitting: Cardiovascular Disease

## 2015-11-10 ENCOUNTER — Other Ambulatory Visit: Payer: Self-pay

## 2015-11-11 ENCOUNTER — Encounter: Payer: Self-pay | Admitting: Cardiovascular Disease

## 2015-11-11 ENCOUNTER — Ambulatory Visit (INDEPENDENT_AMBULATORY_CARE_PROVIDER_SITE_OTHER): Payer: Managed Care, Other (non HMO) | Admitting: Cardiovascular Disease

## 2015-11-11 VITALS — BP 116/72 | HR 61 | Ht 64.0 in | Wt 127.4 lb

## 2015-11-11 DIAGNOSIS — E782 Mixed hyperlipidemia: Secondary | ICD-10-CM | POA: Diagnosis not present

## 2015-11-11 DIAGNOSIS — R7303 Prediabetes: Secondary | ICD-10-CM | POA: Diagnosis not present

## 2015-11-11 DIAGNOSIS — I208 Other forms of angina pectoris: Secondary | ICD-10-CM | POA: Diagnosis not present

## 2015-11-11 NOTE — Patient Instructions (Signed)
Your physician has requested that you have a stress echocardiogram. For further information please visit [email or web address]. Please follow instruction sheet as given. This will be performed at our [facility] location - [address].  Dr Sallyanne Kuster recommends that you schedule a follow-up appointment first available.  If you need a refill on your cardiac medications before your next appointment, please call your pharmacy.

## 2015-11-11 NOTE — Progress Notes (Signed)
Cardiology Office Note    Date:  11/11/2015   ID:  Samantha, Stuart 10/25/1975, MRN [id number]  PCP:  Laurey Morale, MD  Cardiologist:   Sanda Klein, MD  Cardiology consultation requested by Dr. Servando Salina for: Exertional chest pain  Chief Complaint  Patient presents with  . New Patient (Initial Visit)    sob with tightnes. lightheaded.    History of Present Illness:  Samantha Stuart is a 40 y.o. female with borderline diabetes mellitus and mixed hyperlipidemia, but without known structural heart disease, hypertension or coronary or peripheral arterial disease who presents with complaints of chest tightness.  She has chest pain when she becomes emotionally upset, but also when climbing 2 flights of stairs at work. With both scenarios, if she rests and relaxes, the chest discomfort resolves and roughly 5 minutes. It feels like a gripping tightness in the mid retrosternal area. There is no accompanying diaphoresis, palpitations, syncope but she does feel short of breath and dizzy when it happens. She has not had palpitations. She is lean and physically active. Chest discomfort does not occur when she does routine housework, but does happen if she "overdoes it".  Her mother died at age 11 after 59 years of hemodialysis for end-stage renal disease, reportedly secondary to hypertension. She also had atrial fibrillation. No other family members have cardiac illness that she is aware of.  Samantha Stuart is planning possible hysterectomy for large fibroids in the near future.  She reports that her hemoglobin A1c was 6.1% and that she had 3 abnormalities on her lipid profile. Her BMI is only 22 and she looks quite fit. She did have gestational diabetes mellitus with her twin pregnancy.  She has twin boys age 41 as well as an older son who is in the [employer] reserves.  Past Medical History:  Diagnosis Date  . Gestational diabetes   . Pre-eclampsia     Past Surgical  History:  Procedure Laterality Date  . CESAREAN SECTION    . TUBAL LIGATION      Current Medications: No outpatient prescriptions prior to visit.   No facility-administered medications prior to visit.      Allergies:   Augmentin [amoxicillin-pot clavulanate]   Social History   Social History  . Marital status: Single    Spouse name: N/A  . Number of children: N/A  . Years of education: N/A   Social History Main Topics  . Smoking status: Former Research scientist (life sciences)  . Smokeless tobacco: Never Used  . Alcohol use No  . Drug use: No  . Sexual activity: Not Asked   Other Topics Concern  . None   Social History Narrative  . None     Family History:  The patient's family history includes Heart Problems in her mother; Hypertension in her father; Kidney disease in her mother. Mother had end-stage renal disease and was on hemodialysis 26 years before dying at age 37. She had atrial fibrillation and congestive heart failure. She did not have coronary angioplasty/stents or bypass surgery.  ROS:   Please see the history of present illness.    ROS All other systems reviewed and are negative.   PHYSICAL EXAM:   VS:  BP 116/72   Pulse 61   Ht 5\' 4"  (1.626 m)   Wt 127 lb 6.4 oz (57.8 kg)   BMI 21.87 kg/m    GEN: Well nourished, well developed, in no acute distress  HEENT: normal  Neck: no JVD, carotid bruits,  or masses Cardiac: RRR; no murmurs, rubs, or gallops,no edema  Respiratory:  clear to auscultation bilaterally, normal work of breathing GI: soft, nontender, nondistended, + BS MS: no deformity or atrophy  Skin: warm and dry, no rash Neuro:  Alert and Oriented x 3, Strength and sensation are intact Psych: euthymic mood, full affect  Wt Readings from Last 3 Encounters:  11/11/15 127 lb 6.4 oz (57.8 kg)  05/01/14 128 lb (58.1 kg)  04/29/14 124 lb (56.2 kg)      Studies/Labs Reviewed:   EKG:  EKG is ordered today.  The ekg ordered today demonstrates Sinus rhythm, normal  tracing. QTC 414 ms   ASSESSMENT:    1. Exertional angina (HCC)   2. Mixed hyperlipidemia   3. Prediabetes      PLAN:  In order of problems listed above:  1. Angina pectoris: Samantha Stuart has symptoms that are quite concerning for possible exertional angina pectoris. They're currently fairly stable and predictable pattern without acceleration. She has a relatively low risk profile as a premenopausal woman with dyslipidemia and prediabetes as her only risk factors. I have recommended a stress echocardiogram. 2. HLP: Since she is describing "3 parameters" out of range on her lipid profile, I suspect that she has hypertriglyceridemia and low HDL cholesterol in addition to elevated total cholesterol, consistent with insulin resistance. We'll get the actual report from her. She is already at optimal weight. Will have to review operation is for more physical exercise and a diet that is low in sugars and starches with low glycemic index. 3. PreDM: Her hemoglobin A1c is consistent with prediabetes, again despite the fact that she is very lean. 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 We'll review options for more physical exercise and an appropriate diet once we get the full lab results.    Medication Adjustments/Labs and Tests Ordered: Current medicines are reviewed at length with the patient today.  Concerns regarding medicines are outlined above.  Medication changes, Labs and Tests ordered today are listed in the Patient Instructions below. Patient Instructions  Your physician has requested that you have a stress echocardiogram. For further information please visit [email or web address]. Please follow instruction sheet as given. This will be performed at our [facility] location - [address].  Dr Sallyanne Kuster recommends that you schedule a follow-up appointment first available.  If you need a refill on your cardiac medications before your next appointment, please call your  pharmacy.    Signed, Sanda Klein, MD  11/11/2015 1:03 PM    [facility] [address], [place], [place]  [address] Phone: [phone]; Fax: [phone]

## 2015-12-02 ENCOUNTER — Other Ambulatory Visit (HOSPITAL_COMMUNITY): Payer: Managed Care, Other (non HMO)

## 2015-12-08 ENCOUNTER — Other Ambulatory Visit (HOSPITAL_COMMUNITY): Payer: Managed Care, Other (non HMO)

## 2015-12-11 ENCOUNTER — Telehealth (HOSPITAL_COMMUNITY): Payer: Self-pay | Admitting: *Deleted

## 2015-12-11 NOTE — Telephone Encounter (Signed)
Patient given detailed instructions per Stress Test Requisition Sheet for test on 12/17/15 at 2:30.Patient Notified to arrive 30 minutes early, and that it is imperative to arrive on time for appointment to keep from having the test rescheduled.  Patient verbalized understanding. Samantha Stuart

## 2015-12-17 ENCOUNTER — Other Ambulatory Visit (HOSPITAL_COMMUNITY): Payer: Managed Care, Other (non HMO)

## 2016-01-08 ENCOUNTER — Telehealth (HOSPITAL_COMMUNITY): Payer: Self-pay | Admitting: *Deleted

## 2016-01-08 NOTE — Telephone Encounter (Signed)
Patient given detailed instructions per Stress Test Requisition Sheet for test on 01/13/16 at 2;30.Patient Notified to arrive 30 minutes early, and that it is imperative to arrive on time for appointment to keep from having the test rescheduled.  Patient verbalized understanding. Veronia Beets

## 2016-01-13 ENCOUNTER — Other Ambulatory Visit (HOSPITAL_COMMUNITY): Payer: Managed Care, Other (non HMO)

## 2016-01-27 ENCOUNTER — Telehealth: Payer: Self-pay | Admitting: *Deleted

## 2016-01-27 NOTE — Telephone Encounter (Signed)
I left a voice message for Thayer Headings, Dr. Sarajane Jews has not seen this patient since December 2015.

## 2016-01-27 NOTE — Telephone Encounter (Signed)
Thayer Headings, triage and nurse manager at Dr Cousin's office wanted to know what paperwork the pt was given to the pt with her labs (referenced from the lipid panel, and hemoglobin A1C) as she would like to begin giving this to her patients to help as well?  Please call Thayer Headings at [phone].

## 2016-02-12 ENCOUNTER — Other Ambulatory Visit: Payer: Self-pay | Admitting: Obstetrics and Gynecology

## 2016-02-24 ENCOUNTER — Telehealth: Payer: Self-pay | Admitting: Cardiovascular Disease

## 2016-02-24 NOTE — Telephone Encounter (Signed)
Patient cancelled stress echo scheduled for 12/6 2017 and 01/13/2016, spoke with patient today and she did not want to reschedule stress echo

## 2016-03-03 ENCOUNTER — Encounter (HOSPITAL_COMMUNITY)
Admission: RE | Admit: 2016-03-03 | Discharge: 2016-03-03 | Disposition: A | Discharge status: Home / Self Care | Payer: Managed Care, Other (non HMO) | Source: Ambulatory Visit | Attending: Obstetrics and Gynecology | Admitting: Obstetrics and Gynecology

## 2016-03-03 ENCOUNTER — Encounter (HOSPITAL_COMMUNITY): Payer: Self-pay

## 2016-03-03 DIAGNOSIS — Z01812 Encounter for preprocedural laboratory examination: Secondary | ICD-10-CM | POA: Insufficient documentation

## 2016-03-03 DIAGNOSIS — D259 Leiomyoma of uterus, unspecified: Secondary | ICD-10-CM | POA: Diagnosis not present

## 2016-03-03 DIAGNOSIS — N921 Excessive and frequent menstruation with irregular cycle: Secondary | ICD-10-CM | POA: Diagnosis not present

## 2016-03-03 LAB — BASIC METABOLIC PANEL
Anion gap: 6 (ref 5–15)
BUN: 13 mg/dL (ref 6–20)
CHLORIDE: 102 mmol/L (ref 101–111)
CO2: 28 mmol/L (ref 22–32)
CREATININE: 0.8 mg/dL (ref 0.44–1.00)
Calcium: 9.1 mg/dL (ref 8.9–10.3)
Glucose, Bld: 112 mg/dL — ABNORMAL HIGH (ref 65–99)
POTASSIUM: 3.9 mmol/L (ref 3.5–5.1)
SODIUM: 136 mmol/L (ref 135–145)

## 2016-03-03 LAB — CBC
HCT: 37 % (ref 36.0–46.0)
Hemoglobin: 12.3 g/dL (ref 12.0–15.0)
MCH: 29.6 pg (ref 26.0–34.0)
MCHC: 33.2 g/dL (ref 30.0–36.0)
MCV: 89.2 fL (ref 78.0–100.0)
PLATELETS: 296 10*3/uL (ref 150–400)
RBC: 4.15 MIL/uL (ref 3.87–5.11)
RDW: 12.8 % (ref 11.5–15.5)
WBC: 5.7 10*3/uL (ref 4.0–10.5)

## 2016-03-03 NOTE — Patient Instructions (Addendum)
Your procedure is scheduled on:  Monday, Feb. 26, 2018  Enter through the [employer] [facility] at:  11:30 AM  Pick up the phone at the desk and dial [phone].  Call this number if you have problems the morning of surgery: [phone].  Remember: Do NOT eat food:  After 5:00 AM day of surgery  Do NOT drink clear liquids after:  9:00 AM day of surgery  Take these medicines the morning of surgery with a SIP OF WATER:  None  Stop ALL herbal medications at this time  Do NOT smoke the day of surgery.  Do NOT wear jewelry (body piercing), metal hair clips/bobby pins, make-up, or nail polish. Do NOT wear lotions, powders, or perfumes.  You may wear deodorant. Do NOT shave for 48 hours prior to surgery. Do NOT bring valuables to the hospital. Contacts, dentures, or bridgework may not be worn into surgery.  Leave suitcase in car.  After surgery it may be brought to your room.  For patients admitted to the hospital, checkout time is 11:00 AM the day of discharge.  Bring a copy of your healthcare power of attorney and living will documents.  **Effective Friday, Jan. 12, 2018, [place] will implement no hospital visitations from children age 64 and younger due to a steady increase in flu activity in our community and hospitals. **

## 2016-03-07 MED ORDER — GENTAMICIN SULFATE 40 MG/ML IJ SOLN
INTRAVENOUS | Status: AC
  Administered 2016-03-08: 112.75 mL via INTRAVENOUS
  Filled 2016-03-07: qty 6.75

## 2016-03-08 ENCOUNTER — Encounter (HOSPITAL_COMMUNITY)
Admission: RE | Disposition: A | Discharge status: Home / Self Care | Payer: Self-pay | Source: Ambulatory Visit | Attending: Obstetrics and Gynecology

## 2016-03-08 ENCOUNTER — Encounter (HOSPITAL_COMMUNITY): Payer: Self-pay

## 2016-03-08 ENCOUNTER — Ambulatory Visit (HOSPITAL_COMMUNITY): Payer: Managed Care, Other (non HMO) | Admitting: Anesthesiology

## 2016-03-08 ENCOUNTER — Ambulatory Visit (HOSPITAL_COMMUNITY)
Admission: RE | Admit: 2016-03-08 | Discharge: 2016-03-09 | Disposition: A | Discharge status: Home / Self Care | Payer: Managed Care, Other (non HMO) | Source: Ambulatory Visit | Attending: Obstetrics and Gynecology | Admitting: Obstetrics and Gynecology

## 2016-03-08 DIAGNOSIS — D259 Leiomyoma of uterus, unspecified: Secondary | ICD-10-CM | POA: Diagnosis present

## 2016-03-08 DIAGNOSIS — Z87891 Personal history of nicotine dependence: Secondary | ICD-10-CM | POA: Insufficient documentation

## 2016-03-08 DIAGNOSIS — I10 Essential (primary) hypertension: Secondary | ICD-10-CM | POA: Insufficient documentation

## 2016-03-08 DIAGNOSIS — D251 Intramural leiomyoma of uterus: Secondary | ICD-10-CM | POA: Insufficient documentation

## 2016-03-08 DIAGNOSIS — Z880 Allergy status to penicillin: Secondary | ICD-10-CM | POA: Insufficient documentation

## 2016-03-08 DIAGNOSIS — Z90710 Acquired absence of both cervix and uterus: Secondary | ICD-10-CM | POA: Diagnosis present

## 2016-03-08 HISTORY — PX: ROBOTIC ASSISTED TOTAL HYSTERECTOMY WITH SALPINGECTOMY: SHX6679

## 2016-03-08 SURGERY — ROBOTIC ASSISTED TOTAL HYSTERECTOMY WITH SALPINGECTOMY
Anesthesia: General | Site: Abdomen | Laterality: Bilateral

## 2016-03-08 MED ORDER — BUPIVACAINE HCL (PF) 0.25 % IJ SOLN
INTRAMUSCULAR | Status: DC | PRN
  Administered 2016-03-08: 8 mL

## 2016-03-08 MED ORDER — HYDROMORPHONE HCL 1 MG/ML IJ SOLN
INTRAMUSCULAR | Status: DC | PRN
  Administered 2016-03-08: 1 mg via INTRAVENOUS

## 2016-03-08 MED ORDER — HYDROMORPHONE HCL 1 MG/ML IJ SOLN: 0.2000 mg | INTRAMUSCULAR | Status: DC | PRN

## 2016-03-08 MED ORDER — ROCURONIUM BROMIDE 100 MG/10ML IV SOLN
INTRAVENOUS | Status: AC
  Filled 2016-03-08: qty 1

## 2016-03-08 MED ORDER — MENTHOL 3 MG MT LOZG: 1.0000 | LOZENGE | OROMUCOSAL | Status: DC | PRN

## 2016-03-08 MED ORDER — VASOPRESSIN 20 UNIT/ML IV SOLN
INTRAVENOUS | Status: AC
  Filled 2016-03-08: qty 1

## 2016-03-08 MED ORDER — ROPIVACAINE HCL 5 MG/ML IJ SOLN
INTRAMUSCULAR | Status: AC
  Filled 2016-03-08: qty 30

## 2016-03-08 MED ORDER — BUPIVACAINE HCL (PF) 0.25 % IJ SOLN
INTRAMUSCULAR | Status: AC
  Filled 2016-03-08: qty 30

## 2016-03-08 MED ORDER — GLYCOPYRROLATE 0.2 MG/ML IJ SOLN
INTRAMUSCULAR | Status: DC | PRN
  Administered 2016-03-08: 0.2 mg via INTRAVENOUS

## 2016-03-08 MED ORDER — MEPERIDINE HCL 25 MG/ML IJ SOLN: 6.2500 mg | INTRAMUSCULAR | Status: DC | PRN

## 2016-03-08 MED ORDER — DEXAMETHASONE SODIUM PHOSPHATE 10 MG/ML IJ SOLN
INTRAMUSCULAR | Status: DC | PRN
  Administered 2016-03-08: 4 mg via INTRAVENOUS

## 2016-03-08 MED ORDER — LIDOCAINE HCL (CARDIAC) 20 MG/ML IV SOLN
INTRAVENOUS | Status: AC
  Filled 2016-03-08: qty 5

## 2016-03-08 MED ORDER — SODIUM CHLORIDE 0.9 % IJ SOLN
INTRAMUSCULAR | Status: AC
  Filled 2016-03-08: qty 50

## 2016-03-08 MED ORDER — DEXTROSE IN LACTATED RINGERS 5 % IV SOLN
INTRAVENOUS | Status: DC
  Administered 2016-03-08: 20:00:00 via INTRAVENOUS

## 2016-03-08 MED ORDER — HYDROMORPHONE HCL 1 MG/ML IJ SOLN: 0.2500 mg | INTRAMUSCULAR | Status: DC | PRN

## 2016-03-08 MED ORDER — PANTOPRAZOLE SODIUM 40 MG PO TBEC
40.0000 mg | DELAYED_RELEASE_TABLET | Freq: Every day | ORAL | Status: DC
Start: 2016-03-09 — End: 2016-03-09
  Filled 2016-03-08: qty 1

## 2016-03-08 MED ORDER — ONDANSETRON HCL 4 MG/2ML IJ SOLN
INTRAMUSCULAR | Status: AC
  Filled 2016-03-08: qty 2

## 2016-03-08 MED ORDER — ONDANSETRON HCL 4 MG/2ML IJ SOLN: 4.0000 mg | Freq: Four times a day (QID) | INTRAMUSCULAR | Status: DC | PRN

## 2016-03-08 MED ORDER — IBUPROFEN 800 MG PO TABS: 800.0000 mg | ORAL_TABLET | Freq: Three times a day (TID) | ORAL | Status: DC | PRN

## 2016-03-08 MED ORDER — MIDAZOLAM HCL 2 MG/2ML IJ SOLN
INTRAMUSCULAR | Status: AC
  Filled 2016-03-08: qty 2

## 2016-03-08 MED ORDER — HYDROMORPHONE HCL 1 MG/ML IJ SOLN
INTRAMUSCULAR | Status: AC
  Filled 2016-03-08: qty 1

## 2016-03-08 MED ORDER — SCOPOLAMINE 1 MG/3DAYS TD PT72
MEDICATED_PATCH | TRANSDERMAL | Status: AC
  Administered 2016-03-08: 1.5 mg via TRANSDERMAL
  Filled 2016-03-08: qty 1

## 2016-03-08 MED ORDER — BUPIVACAINE HCL (PF) 0.25 % IJ SOLN
INTRAMUSCULAR | Status: AC
  Filled 2016-03-08: qty 10

## 2016-03-08 MED ORDER — ONDANSETRON HCL 4 MG/2ML IJ SOLN
INTRAMUSCULAR | Status: DC | PRN
  Administered 2016-03-08: 4 mg via INTRAVENOUS

## 2016-03-08 MED ORDER — ROCURONIUM BROMIDE 100 MG/10ML IV SOLN
INTRAVENOUS | Status: DC | PRN
  Administered 2016-03-08: 10 mg via INTRAVENOUS
  Administered 2016-03-08: 50 mg via INTRAVENOUS
  Administered 2016-03-08: 20 mg via INTRAVENOUS

## 2016-03-08 MED ORDER — LACTATED RINGERS IR SOLN
Status: DC | PRN
  Administered 2016-03-08: 3000 mL

## 2016-03-08 MED ORDER — LACTATED RINGERS IV SOLN
INTRAVENOUS | Status: DC
  Administered 2016-03-08 (×3): via INTRAVENOUS

## 2016-03-08 MED ORDER — SIMETHICONE 80 MG PO CHEW: 80.0000 mg | CHEWABLE_TABLET | Freq: Four times a day (QID) | ORAL | Status: DC | PRN

## 2016-03-08 MED ORDER — PROPOFOL 10 MG/ML IV BOLUS
INTRAVENOUS | Status: AC
  Filled 2016-03-08: qty 20

## 2016-03-08 MED ORDER — FENTANYL CITRATE (PF) 250 MCG/5ML IJ SOLN
INTRAMUSCULAR | Status: AC
  Filled 2016-03-08: qty 5

## 2016-03-08 MED ORDER — MIDAZOLAM HCL 2 MG/2ML IJ SOLN
INTRAMUSCULAR | Status: DC | PRN
  Administered 2016-03-08: 2 mg via INTRAVENOUS

## 2016-03-08 MED ORDER — SUGAMMADEX SODIUM 200 MG/2ML IV SOLN
INTRAVENOUS | Status: DC | PRN
  Administered 2016-03-08: 200 mg via INTRAVENOUS

## 2016-03-08 MED ORDER — PROPOFOL 10 MG/ML IV BOLUS
INTRAVENOUS | Status: DC | PRN
  Administered 2016-03-08: 150 mg via INTRAVENOUS

## 2016-03-08 MED ORDER — OXYCODONE-ACETAMINOPHEN 5-325 MG PO TABS
1.0000 | ORAL_TABLET | ORAL | Status: DC | PRN
  Administered 2016-03-08: 2 via ORAL
  Administered 2016-03-09 (×2): 1 via ORAL
  Filled 2016-03-08 (×2): qty 1
  Filled 2016-03-08: qty 2

## 2016-03-08 MED ORDER — SUGAMMADEX SODIUM 200 MG/2ML IV SOLN
INTRAVENOUS | Status: AC
  Filled 2016-03-08: qty 2

## 2016-03-08 MED ORDER — LIDOCAINE HCL (CARDIAC) 20 MG/ML IV SOLN
INTRAVENOUS | Status: DC | PRN
  Administered 2016-03-08: 50 mg via INTRAVENOUS

## 2016-03-08 MED ORDER — ONDANSETRON HCL 4 MG PO TABS: 4.0000 mg | ORAL_TABLET | Freq: Four times a day (QID) | ORAL | Status: DC | PRN

## 2016-03-08 MED ORDER — FENTANYL CITRATE (PF) 100 MCG/2ML IJ SOLN
INTRAMUSCULAR | Status: DC | PRN
  Administered 2016-03-08: 100 ug via INTRAVENOUS
  Administered 2016-03-08: 50 ug via INTRAVENOUS
  Administered 2016-03-08: 100 ug via INTRAVENOUS

## 2016-03-08 MED ORDER — SCOPOLAMINE 1 MG/3DAYS TD PT72
1.0000 | MEDICATED_PATCH | Freq: Once | TRANSDERMAL | Status: DC
  Administered 2016-03-08: 1.5 mg via TRANSDERMAL

## 2016-03-08 MED ORDER — DEXAMETHASONE SODIUM PHOSPHATE 4 MG/ML IJ SOLN
INTRAMUSCULAR | Status: AC
  Filled 2016-03-08: qty 1

## 2016-03-08 MED ORDER — PROMETHAZINE HCL 25 MG/ML IJ SOLN: 6.2500 mg | INTRAMUSCULAR | Status: DC | PRN

## 2016-03-08 MED ORDER — LACTATED RINGERS IV SOLN: INTRAVENOUS | Status: DC

## 2016-03-08 MED ORDER — GLYCOPYRROLATE 0.2 MG/ML IJ SOLN
INTRAMUSCULAR | Status: AC
  Filled 2016-03-08: qty 1

## 2016-03-08 SURGICAL SUPPLY — 66 items
ADH SKN CLS APL DERMABOND .7 (GAUZE/BANDAGES/DRESSINGS) ×1
APL SRG 38 LTWT LNG FL B (MISCELLANEOUS) ×1
APPLICATOR ARISTA FLEXITIP XL (MISCELLANEOUS) ×2 IMPLANT
BARRIER ADHS 3X4 INTERCEED (GAUZE/BANDAGES/DRESSINGS) IMPLANT
BRR ADH 4X3 ABS CNTRL BYND (GAUZE/BANDAGES/DRESSINGS)
CATH FOLEY 3WAY  5CC 16FR (CATHETERS) ×2
CATH FOLEY 3WAY 5CC 16FR (CATHETERS) ×1 IMPLANT
CELL SAVER LIPIGURD (MISCELLANEOUS) ×1 IMPLANT
CLOTH BEACON ORANGE TIMEOUT ST (SAFETY) ×3 IMPLANT
CONT PATH 16OZ SNAP LID 3702 (MISCELLANEOUS) ×3 IMPLANT
COVER BACK TABLE 60X90IN (DRAPES) ×6 IMPLANT
COVER TIP SHEARS 8 DVNC (MISCELLANEOUS) ×1 IMPLANT
COVER TIP SHEARS 8MM DA VINCI (MISCELLANEOUS) ×2
DECANTER SPIKE VIAL GLASS SM (MISCELLANEOUS) ×4 IMPLANT
DEFOGGER SCOPE WARMER CLEARIFY (MISCELLANEOUS) ×3 IMPLANT
DERMABOND ADVANCED (GAUZE/BANDAGES/DRESSINGS) ×2
DERMABOND ADVANCED .7 DNX12 (GAUZE/BANDAGES/DRESSINGS) ×1 IMPLANT
DEVICE RETRIEVAL ALEXIS 14 (MISCELLANEOUS) IMPLANT
DURAPREP 26ML APPLICATOR (WOUND CARE) ×3 IMPLANT
ELECT REM PT RETURN 9FT ADLT (ELECTROSURGICAL) ×3
ELECTRODE REM PT RTRN 9FT ADLT (ELECTROSURGICAL) ×1 IMPLANT
EXTRT SYSTEM ALEXIS 14CM (MISCELLANEOUS) ×3
GAUZE VASELINE 3X9 (GAUZE/BANDAGES/DRESSINGS) IMPLANT
GLOVE BIOGEL PI IND STRL 7.0 (GLOVE) ×5 IMPLANT
GLOVE BIOGEL PI INDICATOR 7.0 (GLOVE) ×10
GLOVE ECLIPSE 6.5 STRL STRAW (GLOVE) ×9 IMPLANT
HEMOSTAT ARISTA ABSORB 3G PWDR (MISCELLANEOUS) ×2 IMPLANT
KIT ACCESSORY DA VINCI DISP (KITS) ×2
KIT ACCESSORY DVNC DISP (KITS) ×1 IMPLANT
LEGGING LITHOTOMY PAIR STRL (DRAPES) ×5 IMPLANT
NEEDLE INSUFFLATION 150MM (ENDOMECHANICALS) ×3 IMPLANT
OCCLUDER COLPOPNEUMO (BALLOONS) ×3 IMPLANT
PACK ROBOT WH (CUSTOM PROCEDURE TRAY) ×3 IMPLANT
PACK ROBOTIC GOWN (GOWN DISPOSABLE) ×3 IMPLANT
PACK TRENDGUARD 450 HYBRID PRO (MISCELLANEOUS) IMPLANT
PACK TRENDGUARD 600 HYBRD PROC (MISCELLANEOUS) IMPLANT
PAD PREP 24X48 CUFFED NSTRL (MISCELLANEOUS) ×3 IMPLANT
POUCH LAPAROSCOPIC INSTRUMENT (MISCELLANEOUS) ×2 IMPLANT
PROTECTOR NERVE ULNAR (MISCELLANEOUS) ×6 IMPLANT
SET CYSTO W/LG BORE CLAMP LF (SET/KITS/TRAYS/PACK) IMPLANT
SET IRRIG TUBING LAPAROSCOPIC (IRRIGATION / IRRIGATOR) ×3 IMPLANT
SET TRI-LUMEN FLTR TB AIRSEAL (TUBING) ×3 IMPLANT
SLEEVE SURGEON STRL (DRAPES) ×2 IMPLANT
SUT MNCRL AB 4-0 PS2 18 (SUTURE) ×8 IMPLANT
SUT PLAIN 2 0 (SUTURE) ×3
SUT PLAIN ABS 2-0 CT1 27XMFL (SUTURE) IMPLANT
SUT VIC AB 0 CT1 36 (SUTURE) ×6 IMPLANT
SUT VICRYL 0 UR6 27IN ABS (SUTURE) ×3 IMPLANT
SUT VICRYL 4-0 PS2 18IN ABS (SUTURE) ×2 IMPLANT
SUT VLOC 180 0 9IN  GS21 (SUTURE) ×4
SUT VLOC 180 0 9IN GS21 (SUTURE) IMPLANT
SYR 50ML LL SCALE MARK (SYRINGE) ×3 IMPLANT
SYRINGE 10CC LL (SYRINGE) ×3 IMPLANT
TIP RUMI ORANGE 6.7MMX12CM (TIP) IMPLANT
TIP UTERINE 5.1X6CM LAV DISP (MISCELLANEOUS) IMPLANT
TIP UTERINE 6.7X10CM GRN DISP (MISCELLANEOUS) ×2 IMPLANT
TIP UTERINE 6.7X6CM WHT DISP (MISCELLANEOUS) IMPLANT
TIP UTERINE 6.7X8CM BLUE DISP (MISCELLANEOUS) IMPLANT
TOWEL OR 17X24 6PK STRL BLUE (TOWEL DISPOSABLE) ×9 IMPLANT
TRENDGUARD 450 HYBRID PRO PACK (MISCELLANEOUS) ×3
TRENDGUARD 600 HYBRID PROC PK (MISCELLANEOUS)
TROCAR DISP BLADELESS 8 DVNC (TROCAR) ×1 IMPLANT
TROCAR DISP BLADELESS 8MM (TROCAR) ×2
TROCAR PORT AIRSEAL 8X120 (TROCAR) ×3 IMPLANT
TROCAR Z-THREAD 12X150 (TROCAR) ×3 IMPLANT
WATER STERILE IRR 1000ML POUR (IV SOLUTION) ×3 IMPLANT

## 2016-03-08 NOTE — Brief Op Note (Signed)
03/08/2016  5:59 PM  PATIENT:  Samantha Stuart  41 y.o. female  PRE-OPERATIVE DIAGNOSIS:  Symptomatic Uterine Fibroids, Previous Cesarean Section  POST-OPERATIVE DIAGNOSIS:  Symptomatic Uterine Fibroids, Previous Cesarean Section  PROCEDURE:  Da Vinci robotic total hysterectomy, bilateral salpingectomy  SURGEON:  Surgeon(s) and Role:    * Servando Salina, MD - Primary  PHYSICIAN ASSISTANT:   ASSISTANTS: Derrell Lolling, CNM   ANESTHESIA:   general Findings: left ovary and tubes adherent to left ant wall, 14 weeks fibroid uterus, nl right tube, right ov nl, ureters  Bilaterally peristalsis nl, nl liver edge, nl appendix EBL:  Total I/O In: 2000 [I.V.:2000] Out: 425 [Urine:225; Blood:200]  BLOOD ADMINISTERED:none  DRAINS: none   LOCAL MEDICATIONS USED:  MARCAINE     SPECIMEN:  Source of Specimen:  uterus with cervix, tubes  DISPOSITION OF SPECIMEN:  PATHOLOGY  COUNTS:  YES  TOURNIQUET:  * No tourniquets in log *  DICTATION: .Other Dictation: Dictation Number [id number]  PLAN OF CARE: Admit for overnight observation  PATIENT DISPOSITION:  PACU - hemodynamically stable.   Delay start of Pharmacological VTE agent (>24hrs) due to surgical blood loss or risk of bleeding: no

## 2016-03-08 NOTE — Anesthesia Procedure Notes (Signed)
Procedure Name: Intubation Date/Time: 03/08/2016 1:05 PM Performed by: Jonna Munro Pre-anesthesia Checklist: Patient identified, Emergency Drugs available, Suction available, Patient being monitored and Timeout performed Patient Re-evaluated:Patient Re-evaluated prior to inductionOxygen Delivery Method: Circle system utilized Preoxygenation: Pre-oxygenation with 100% oxygen Intubation Type: IV induction Laryngoscope Size: Mac and 4 Grade View: Grade I Tube type: Oral Tube size: 7.0 mm Number of attempts: 2 Airway Equipment and Method: Stylet Placement Confirmation: ETT inserted through vocal cords under direct vision Secured at: 2 cm Tube secured with: Tape Dental Injury: Teeth and Oropharynx as per pre-operative assessment  Comments: DLx 2 by CRNA, unable to visualize cords, easy, atraumatic intubation with MAC 4 by Dr. Smith Robert.

## 2016-03-08 NOTE — Anesthesia Preprocedure Evaluation (Addendum)
Anesthesia Evaluation  Patient identified by MRN, date of birth, ID band Patient awake    Reviewed: Allergy & Precautions, NPO status , Patient's Chart, lab work & pertinent test results  Airway Mallampati: II  TM Distance: >3 FB Neck ROM: Full    Dental  (+) Teeth Intact, Dental Advisory Given   Pulmonary former smoker,    breath sounds clear to auscultation       Cardiovascular hypertension,  Rhythm:Regular Rate:Normal     Neuro/Psych  Headaches, negative psych ROS   GI/Hepatic negative GI ROS, Neg liver ROS,   Endo/Other  diabetes, Gestational  Renal/GU negative Renal ROS  negative genitourinary   Musculoskeletal negative musculoskeletal ROS (+)   Abdominal   Peds negative pediatric ROS (+)  Hematology negative hematology ROS (+)   Anesthesia Other Findings   Reproductive/Obstetrics negative OB ROS                            Anesthesia Physical Anesthesia Plan  ASA: II  Anesthesia Plan: General   Post-op Pain Management:    Induction: Intravenous  Airway Management Planned: Oral ETT  Additional Equipment:   Intra-op Plan:   Post-operative Plan: Extubation in OR  Informed Consent: I have reviewed the patients History and Physical, chart, labs and discussed the procedure including the risks, benefits and alternatives for the proposed anesthesia with the patient or authorized representative who has indicated his/her understanding and acceptance.   Dental advisory given  Plan Discussed with: CRNA  Anesthesia Plan Comments:         Anesthesia Quick Evaluation

## 2016-03-08 NOTE — Transfer of Care (Signed)
Immediate Anesthesia Transfer of Care Note  Patient: Samantha Stuart  Procedure(s) Performed: Procedure(s): ROBOTIC ASSISTED TOTAL HYSTERECTOMY WITH SALPINGECTOMY (Bilateral)  Patient Location: PACU  Anesthesia Type:General  Level of Consciousness: awake, alert  and oriented  Airway & Oxygen Therapy: Patient Spontanous Breathing and Patient connected to nasal cannula oxygen  Post-op Assessment: Report given to RN and Post -op Vital signs reviewed and stable  Post vital signs: Reviewed and stable  Last Vitals:  Vitals:   03/08/16 1050 03/08/16 1751  BP: 100/68 116/75  Pulse: (!) 57 79  Resp: 16 15  Temp: 36.3 C 36.6 C    Last Pain:  Vitals:   03/08/16 1050  TempSrc: Oral      Patients Stated Pain Goal: 4 ([id number])  Complications: No apparent anesthesia complications

## 2016-03-09 DIAGNOSIS — D251 Intramural leiomyoma of uterus: Secondary | ICD-10-CM | POA: Diagnosis not present

## 2016-03-09 LAB — BASIC METABOLIC PANEL
ANION GAP: 7 (ref 5–15)
BUN: 8 mg/dL (ref 6–20)
CALCIUM: 8.1 mg/dL — AB (ref 8.9–10.3)
CO2: 26 mmol/L (ref 22–32)
CREATININE: 0.77 mg/dL (ref 0.44–1.00)
Chloride: 101 mmol/L (ref 101–111)
GFR calc Af Amer: >60 mL/min (ref >60)
GLUCOSE: 133 mg/dL — AB (ref 65–99)
Potassium: 3.8 mmol/L (ref 3.5–5.1)
Sodium: 134 mmol/L — ABNORMAL LOW (ref 135–145)

## 2016-03-09 LAB — CBC
HCT: 28.9 % — ABNORMAL LOW (ref 36.0–46.0)
Hemoglobin: 9.6 g/dL — ABNORMAL LOW (ref 12.0–15.0)
MCH: 30 pg (ref 26.0–34.0)
MCHC: 33.2 g/dL (ref 30.0–36.0)
MCV: 90.3 fL (ref 78.0–100.0)
PLATELETS: 201 10*3/uL (ref 150–400)
RBC: 3.2 MIL/uL — ABNORMAL LOW (ref 3.87–5.11)
RDW: 12.1 % (ref 11.5–15.5)
WBC: 9.1 10*3/uL (ref 4.0–10.5)

## 2016-03-09 MED ORDER — OXYCODONE-ACETAMINOPHEN 5-325 MG PO TABS: 1.0000 | ORAL_TABLET | ORAL | 0 refills | Status: DC | PRN

## 2016-03-09 MED ORDER — IBUPROFEN 800 MG PO TABS: 800.0000 mg | ORAL_TABLET | Freq: Three times a day (TID) | ORAL | 2 refills | Status: DC | PRN

## 2016-03-09 NOTE — Op Note (Signed)
NAME:  Essex, Arijana                   ACCOUNT NO.:  MEDICAL RECORD NO.:  [id number]  LOCATION:                                 FACILITY:  PHYSICIAN:  Servando Salina, M.D.    DATE OF BIRTH:  DATE OF PROCEDURE:  03/08/2016 DATE OF DISCHARGE:                              OPERATIVE REPORT   PREOPERATIVE DIAGNOSIS:  Symptomatic uterine fibroids.  Previous cesarean section.  PROCEDURES:  Da Vinci robotic total hysterectomy, bilateral salpingectomy.  POSTOPERATIVE DIAGNOSIS:  Symptomatic uterine fibroids.  Previous cesarean section.  ANESTHESIA:  General.  SURGEON:  Servando Salina, MD.  ASSISTANT:  Derrell Lolling, CNM.  DESCRIPTION OF PROCEDURE:  Under adequate general anesthesia, the patient was placed in the dorsal lithotomy position.  She was positioned for robotic surgery.  Examination under anesthesia revealed an irregular 14 to 15-week size uterus.  No adnexal masses could be appreciated.  The patient was sterilely prepped and draped in the usual fashion. Indwelling Foley catheter was sterilely placed.  A weighted speculum was placed in the vagina.  Sims retractor was placed anteriorly.  The cervix was parous.  The anterior and posterior lip of the cervix had 0 Vicryl figure-of-eight sutures placed.  The cervix was dilated and sounded to 10 cm.  A 10 mm uterine manipulator with large RUMI cup was introduced into the uterine cavity without incident.  The retractors were removed. Attention was then turned to the abdomen.  The supraumbilical vertical incision was made after 0.25% Marcaine was injected.  Veress needle was Introduced, tested and  2 L of CO2 was insufflated with an opening pressure of 8.  Veress needle was then removed.  A 12 mm disposable trocar with sleeve was introduced into the abdomen without incident.  The robotic camera was introduced and the pelvis was inspected.  Entry into the abdomen was unremarkable.  Normal liver edge. The left ovary and  tube were adherent to the left anterior abdominal wall.  The uterus was irregular with multiple fibroids were noted.  The right tube and ovary were otherwise normal.  Surgical tubal separation was noted and appendix was noted.  The uterus had limited mobility, but was thought due to the adhesions, etc.  Decision was made to proceed with a robotic approach.  Two robotic 8 mm ports were placed on the left 8 cm apart, and on the right one robotic port was placed between the right lower quadrant and the camera, and an 8 mm air seal apparatus was placed in the right lower quadrant.  These were all placed under direct visualization.  The robot was docked to the patient's left and in arm #1 the monopolar scissors were placed, in arm #2 a PK dissector was placed, and arm #3 a ProGrasp was then placed.  I then went to the surgical Console.  The procedure was started with lysing of the left ovary off the anterior wall.  The distal portion of the separated tube was also removed with serial clamping of the mesosalpinx performed and the tube was removed.  The utero-ovarian ligament was shortened on the left due to the large subserosal fibroid near its proximal area.  At that  point, attention was then to the right side with its more mobile state the right retroperitoneal space was opened.  The right utero-ovarian ligament was serially clamped, cauterized, and then cut.  Ureters were noted to be peristalsing well deep in the field.  The anterior leaf of the broad ligament was subsequently further opened after the right round ligament was serially clamped, cauterized, and then cut.  The posterior leaf was also opened and carried down as well the bladder.  Peritoneum was then opened transversely, and dissected down with sharp dissection and pushing the bladder over the cup.  The uterine vessels were subsequently identified, serially clamped, cauterized, and then cut.  Attention was then turned back now to  the left side.  On the left side, the ovary was held close to the fibroid and utero-ovarian ligament at that point on the left was serially clamped, cauterized, and then cut until the ovary was displaced superiorly.  The retroperitoneal space was partially opened on the left. The round ligament on the left was shortened as well.  It was serially clamped, cauterized, and then cut.  The posterior leaf of the broad ligament was carried all the way down to displace the ureter inferiorly further.  Anteriorly, the remaining portion of vesicouterine peritoneum was then opened and displaced inferiorly.  After manipulation due to the large uterus, uterine vessels on the left were finally seen, serially clamped, cauterized, and then cut.  Once that was done, the vaginal insufflator was placed in the vagina.  The bladder was further displaced inferiorly and a curvilinear incision was made at the cervicovaginal junction circumferentially with detachment of the uterus from its vagina.  Due to the large size, it was noted that the fibroid was not going to able to pull through vagina  without morcellation.  The bladder was then further displaced inferiorly with sharp dissection.  The vaginal cuff had some bleeders which were cauterized.  Decision was made to do morcellation to the back.  At that point, the RUMI cup and apparatus were then removed and the specimen was displaced superiorly.  With the hemostasis better noted along the vaginal cuff, the instruments were replaced with monopolar scissors by the large mega suture needle driver and a long tipped forceps for the PK.  The PK dissector was then placed in the third arm.  Using 0 V-Loc suture, the vaginal cuff was closed with 0 V-Loc x2.  Bleeding from the bladder area resulted in prolonged period of time being done to hemostasis in that area along with partially reducing the abdominal pressure in order to facilitate more bleeders being seen.  The  bladder in particular on the left was notable for bleeding which was subsequently hemostasis.  The ovarian pedicles were identified and cauterized.  The areas that were bleeding all appeared to be partially raw.  Irrigation and suction was done throughout.  I then had the robotic instruments removed, the robot undocked, and returned to the abdomen.  At the abdomen, the supraumbilical port site was opened further and the large morcellation bag was placed.  The specimen which had been removed down below was finally put in the bag and brought up into the field in the superior area.  It was then identified and morcellated in pieces with subsequent removal of the bag.  Pursestring was placed on the suprapubic incision.  The trocar was then reinserted back into the supraumbilical site and the pelvis was again inspected, irrigated, and small pieces of tissue was removed.  The vaginal cuff again had good hemostasis.  Pedicles were also with good hemostasis. Potato starch was placed overlying the vaginal cuff.  The pelvis and abdomen had been suctioned of debris.  Once this all was done, the robotic port sites were all removed.  The AirSeal was deflated.  The fascia was closed with a pursestring suture and the skin incisions were approximated using either 4-0 Vicryl and/or 4-0 Monocryl subcuticular closure.  The vaginal cuff was digitally inspected which had been done in the case as well with good approximation noted. Specimen was the morcellated uterus and cervix as well as the tubes and sent to Pathology.  ESTIMATED BLOOD LOSS:  200 mL.  INTRAOPERATIVE FLUIDS:  2 L.  URINE OUTPUT:  225 mL.  COUNTS:  Sponge and instrument counts x2 were correct.  COMPLICATIONS:  None.  The patient tolerated procedure well, was transferred to recovery room in stable condition.     Servando Salina, M.D.     [place]/MEDQ  D:  03/08/2016  T:  03/09/2016  Job:  [id number]

## 2016-03-09 NOTE — Anesthesia Postprocedure Evaluation (Addendum)
Anesthesia Post Note  Patient: Samantha Stuart  Procedure(s) Performed: Procedure(s) (LRB): ROBOTIC ASSISTED TOTAL HYSTERECTOMY WITH SALPINGECTOMY (Bilateral)  Patient location during evaluation: PACU Anesthesia Type: General Level of consciousness: awake and alert Pain management: pain level controlled Vital Signs Assessment: post-procedure vital signs reviewed and stable Respiratory status: spontaneous breathing, nonlabored ventilation, respiratory function stable and patient connected to nasal cannula oxygen Cardiovascular status: blood pressure returned to baseline and stable Postop Assessment: no signs of nausea or vomiting Anesthetic complications: no       Last Vitals:  Vitals:   03/08/16 2345 03/09/16 0420  BP: 101/62 92/67  Pulse: 80 65  Resp: 18 16  Temp:  36.7 C    Last Pain:  Vitals:   03/09/16 0730  TempSrc:   PainSc: 2                  Effie Berkshire

## 2016-03-09 NOTE — Progress Notes (Signed)
Discharge teaching complete with pt. Pt understood all information and did not have any questions. Pt ambulated out of the hospital and discharged home to family.  

## 2016-03-09 NOTE — Discharge Instructions (Signed)
Call if temperature greater than equal to 100.4, nothing per vagina for 4-6 weeks or severe nausea vomiting, increased incisional pain , drainage or redness in the incision site, no straining with bowel movements, showers no bath °

## 2016-03-09 NOTE — Discharge Summary (Signed)
Physician Discharge Summary  Patient ID: Samantha Stuart MRN: [id number] DOB/AGE: December 25, 1975 41 y.o.  Admit date: 03/08/2016 Discharge date: 03/09/2016  Admission Diagnoses: symptomatic uterine fibroid, previous cesarean section  Discharge Diagnoses: same Active Problems:   S/P hysterectomy   Discharged Condition: stable  Hospital Course: pt was admitted to [facility] where she underwent davinci robotic total hysterectomy, bilateral Salpingectomy. Pt had uncomplicated postoperative course  Consults: None  Significant Diagnostic Studies: labs:  CBC    Component Value Date/Time   WBC 9.1 03/09/2016 0508   RBC 3.20 (L) 03/09/2016 0508   HGB 9.6 (L) 03/09/2016 0508   HCT 28.9 (L) 03/09/2016 0508   PLT 201 03/09/2016 0508   MCV 90.3 03/09/2016 0508   MCH 30.0 03/09/2016 0508   MCHC 33.2 03/09/2016 0508   RDW 12.1 03/09/2016 0508   LYMPHSABS 2.5 03/04/2011 1347   MONOABS 0.6 03/04/2011 1347   EOSABS 0.1 03/04/2011 1347   BASOSABS 0.0 03/04/2011 1347   Lab Results  Component Value Date   CREATININE 0.77 03/09/2016   CREATININE 0.80 03/03/2016   CREATININE 0.82 05/01/2014    Treatments: surgery: DaVinci robotic total hysterectomy, bilateral salpingectomy  Discharge Exam: Blood pressure (!) 101/54, pulse 65, temperature 98 F (36.7 C), temperature source Oral, resp. rate 16, height 5\' 4"  (1.626 m), weight 57.2 kg (126 lb), last menstrual period 02/21/2016, SpO2 98 %. General appearance: alert, cooperative and no distress Back: no tenderness to percussion or palpation Resp: clear to auscultation bilaterally Cardio: regular rate and rhythm, S1, S2 normal, no murmur, click, rub or gallop GI: soft protuberant hypoactive BS Pelvic: deferred Extremities: no edema, redness or tenderness in the calves or thighs Skin: Skin color, texture, turgor normal. No rashes or lesions Incision/Wound: clean/dry/intact  Disposition: 01-Home or Self Care  Discharge Instructions    Call MD  for:  persistant nausea and vomiting    Complete by:  As directed    Call MD for:  temperature >100.4    Complete by:  As directed    Diet - low sodium heart healthy    Complete by:  As directed    Discharge wound care:    Complete by:  As directed    Remove upper bandage only in 4 days   Increase activity slowly    Complete by:  As directed    May shower / Bathe    Complete by:  As directed    May walk up steps    Complete by:  As directed    No dressing needed    Complete by:  As directed      Allergies as of 03/09/2016      Reactions   Augmentin [amoxicillin-pot Clavulanate] Anaphylaxis, Hives   Per patient "feels like throat was closing up" Has patient had a PCN reaction causing immediate rash, facial/tongue/throat swelling, SOB or lightheadedness with hypotension: unknown Has patient had a PCN reaction causing severe rash involving mucus membranes or skin necrosis: unknown Has patient had a PCN reaction that required hospitalization unknown Has patient had a PCN reaction occurring within the last 10 years: unknown If all of the above answers are "NO", then may proceed with Cephalosporin       Medication List    STOP taking these medications   phenazopyridine 200 MG tablet Commonly known as:  PYRIDIUM     TAKE these medications   acetaminophen 325 MG tablet Commonly known as:  TYLENOL Take 650 mg by mouth every 6 (six) hours as needed for  mild pain.   ibuprofen 800 MG tablet Commonly known as:  ADVIL,MOTRIN Take 1 tablet (800 mg total) by mouth every 8 (eight) hours as needed (mild pain). What changed:  medication strength  how much to take  reasons to take this   oxyCODONE-acetaminophen 5-325 MG tablet Commonly known as:  PERCOCET/ROXICET Take 1-2 tablets by mouth every 4 (four) hours as needed for severe pain (moderate to severe pain (when tolerating fluids)).      Follow-up Information    Arlind Klingerman A, MD Follow up in 2 week(s).   Specialty:   Obstetrics and Gynecology Contact information: [address] [place] [address] [phone]           Signed: Servando Salina A 03/09/2016, 10:55 AM

## 2016-03-09 NOTE — Progress Notes (Signed)
Subjective: Patient reports tolerating PO and no problems voiding. Per pt, belly looks the same as before.    Objective: I have reviewed patient's vital signs.  vital signs, intake and output, medications and labs. Vitals:   03/09/16 0420 03/09/16 0805  BP: 92/67 (!) 101/54  Pulse: 65 65  Resp: 16 16  Temp: 98 F (36.7 C)    I/O last 3 completed shifts: In: 2480 [P.O.:480; I.V.:2000] Out: 2600 [Urine:2400; Blood:200] Total I/O In: 720 [P.O.:720] Out: 350 [Urine:350]  Lab Results  Component Value Date   WBC 9.1 03/09/2016   HGB 9.6 (L) 03/09/2016   HCT 28.9 (L) 03/09/2016   MCV 90.3 03/09/2016   PLT 201 03/09/2016   Lab Results  Component Value Date   CREATININE 0.77 03/09/2016    EXAM General: alert, cooperative and no distress Resp: clear to auscultation bilaterally Cardio: regular rate and rhythm, S1, S2 normal, no murmur, click, rub or gallop GI: incision: clean, dry and intact and soft protuberant (+) hypoactive BS Extremities: no edema, redness or tenderness in the calves or thighs Vaginal Bleeding: none  Assessment: s/p Procedure(s): ROBOTIC ASSISTED TOTAL HYSTERECTOMY WITH SALPINGECTOMY: stable  Plan: Advance diet Encourage ambulation Discontinue IV fluids Discharge home  D/c instructions reviewed. May use warm heat to abdomen and ambulate  LOS: 0 days    Julie Paolini A, MD 03/09/2016 10:46 AM    03/09/2016, 10:46 AM

## 2016-03-10 ENCOUNTER — Encounter (HOSPITAL_COMMUNITY): Payer: Self-pay | Admitting: Obstetrics and Gynecology

## 2016-06-11 NOTE — Addendum Note (Signed)
Addendum  created 06/11/16 1039 by Effie Berkshire, MD   Sign clinical note

## 2018-01-07 ENCOUNTER — Encounter (HOSPITAL_COMMUNITY): Payer: Self-pay | Admitting: Emergency Medicine

## 2018-01-07 ENCOUNTER — Ambulatory Visit (HOSPITAL_COMMUNITY)
Admission: EM | Admit: 2018-01-07 | Discharge: 2018-01-07 | Disposition: A | Discharge status: Home / Self Care | Payer: 59 | Attending: Physician Assistant | Admitting: Physician Assistant

## 2018-01-07 ENCOUNTER — Other Ambulatory Visit: Payer: Self-pay

## 2018-01-07 DIAGNOSIS — I209 Angina pectoris, unspecified: Secondary | ICD-10-CM | POA: Diagnosis not present

## 2018-01-07 MED ORDER — DIAZEPAM 5 MG PO TABS: 5.0000 mg | ORAL_TABLET | Freq: Two times a day (BID) | ORAL | 0 refills | Status: DC

## 2018-01-07 MED ORDER — TRAMADOL HCL 50 MG PO TABS: 50.0000 mg | ORAL_TABLET | Freq: Four times a day (QID) | ORAL | 0 refills | Status: DC | PRN

## 2018-01-07 NOTE — ED Provider Notes (Signed)
01/07/2018 3:24 PM   DOB: 03-May-1975 / MRN: [id number]  SUBJECTIVE:  Samantha Stuart is a 42 y.o. female presenting for chest pain and shortness of breath.  She has a history of angina pectoris and is lost to follow-up with cardiology.  She was last seen by cards in 2017 where an echo and a stress test were ordered however the patient never presented for testing.  She was called, by the cardiologist, and those notes indicate the patient did not wish to proceed with any testing.  She has a former smoker.  Has a history of prediabetes.  She is allergic to augmentin [amoxicillin-pot clavulanate].   She  has a past medical history of Gestational diabetes and Pre-eclampsia.    She  reports that she has quit smoking. She has never used smokeless tobacco. She reports current alcohol use. She reports that she does not use drugs. She  has no history on file for sexual activity. The patient  has a past surgical history that includes Tubal ligation; Cesarean section; and Robotic assisted total hysterectomy with salpingectomy (Bilateral, 03/08/2016).  Her family history includes Coronary artery disease in an other family member; Diabetes in an other family member; Heart Problems in her mother; Hypertension in her father and another family member; Kidney disease in her mother.  Review of Systems  Constitutional: Negative for diaphoresis.  Respiratory: Positive for shortness of breath. Negative for cough, hemoptysis, sputum production and wheezing.   Cardiovascular: Positive for chest pain. Negative for leg swelling and PND.  Gastrointestinal: Negative for nausea.  Neurological: Negative for dizziness.    OBJECTIVE:  BP 112/74 (BP Location: Left Arm)   Pulse 64   Temp 98.1 F (36.7 C) (Oral)   Resp 18   SpO2 98%   Wt Readings from Last 3 Encounters:  03/09/16 126 lb (57.2 kg)  03/03/16 126 lb 6 oz (57.3 kg)  11/11/15 127 lb 6.4 oz (57.8 kg)   Temp Readings from Last 3 Encounters:  01/07/18  98.1 F (36.7 C) (Oral)  03/09/16 98 F (36.7 C) (Oral)  03/03/16 97.3 F (36.3 C) (Oral)   BP Readings from Last 3 Encounters:  01/07/18 112/74  03/09/16 (!) 101/54  03/03/16 97/71   Pulse Readings from Last 3 Encounters:  01/07/18 64  03/09/16 65  03/03/16 60    Physical Exam Vitals signs reviewed.  Constitutional:      General: She is not in acute distress.    Appearance: She is not diaphoretic.  Eyes:     Pupils: Pupils are equal, round, and reactive to light.  Cardiovascular:     Rate and Rhythm: Normal rate and regular rhythm.     Pulses: No decreased pulses.     Heart sounds: Normal heart sounds, S1 normal and S2 normal. No murmur. No friction rub. No gallop.   Pulmonary:     Effort: Pulmonary effort is normal. No respiratory distress.     Breath sounds: No stridor. No decreased breath sounds, wheezing, rhonchi or rales.  Chest:     Chest wall: Tenderness (About the right upper trapezius with radicular pattern to her left upper chest) present.    Abdominal:     General: There is no distension.  Skin:    General: Skin is dry.  Neurological:     Mental Status: She is alert and oriented to person, place, and time.     Cranial Nerves: No cranial nerve deficit.     Gait: Gait normal.  No results found for this or any previous visit (from the past 72 hour(s)).  No results found.  ASSESSMENT AND PLAN:   Angina pectoris [facility]): There certainly appears to be a cervical component to her chest pain today. She has a history of angina pectoris and has been lost to follow up with cardiology.  She is in no distress today and her vitals have been stable since today's visit.  I have a very low suspicion for a cardiac etiology today however she needs to follow up with her cardiologist to have her previous echo and stress test performed.  No history of DVT/PE and she is a non smoker and does not take estrogen products. No recent travel thus no risk factors.    Discharge  Instructions     You EKG shows no changes today.  Please call Dr. Griffin Dakin office (your cardiologist) and see him as soon as possible so he may order your echo and stress test.         The patient is advised to call or return to clinic if she does not see an improvement in symptoms, or to seek the care of the closest emergency department if she worsens with the above plan.   Philis Fendt, MHS, PA-C 01/07/2018 3:24 PM   Tereasa Coop, PA-C 01/07/18 1526

## 2018-01-07 NOTE — Discharge Instructions (Addendum)
You EKG shows no changes today.  Please call Dr. Griffin Dakin office (your cardiologist) and see him as soon as possible so he may order your echo and stress test.

## 2018-01-07 NOTE — ED Triage Notes (Addendum)
Chest pain started today, no particular activity.  Pain started around 8 this morning.  pain in center chest, pain around left side of torso/chest to back.  Patient has nausea, sob. Describes pain as sharp, worse with movement

## 2018-10-19 ENCOUNTER — Other Ambulatory Visit: Payer: Self-pay | Admitting: Obstetrics and Gynecology

## 2018-10-19 DIAGNOSIS — N644 Mastodynia: Secondary | ICD-10-CM

## 2018-10-23 ENCOUNTER — Ambulatory Visit
Admission: RE | Admit: 2018-10-23 | Discharge: 2018-10-23 | Disposition: A | Discharge status: Home / Self Care | Payer: 59 | Source: Ambulatory Visit | Attending: Obstetrics and Gynecology | Admitting: Obstetrics and Gynecology

## 2018-10-23 ENCOUNTER — Other Ambulatory Visit: Payer: Self-pay

## 2018-10-23 DIAGNOSIS — N644 Mastodynia: Secondary | ICD-10-CM

## 2020-04-16 ENCOUNTER — Ambulatory Visit
Admission: EM | Admit: 2020-04-16 | Discharge: 2020-04-16 | Disposition: A | Discharge status: Home / Self Care | Payer: No Typology Code available for payment source | Attending: Emergency Medicine | Admitting: Emergency Medicine

## 2020-04-16 ENCOUNTER — Ambulatory Visit (INDEPENDENT_AMBULATORY_CARE_PROVIDER_SITE_OTHER): Payer: No Typology Code available for payment source

## 2020-04-16 ENCOUNTER — Other Ambulatory Visit: Payer: Self-pay

## 2020-04-16 DIAGNOSIS — R0602 Shortness of breath: Secondary | ICD-10-CM

## 2020-04-16 DIAGNOSIS — Z8616 Personal history of COVID-19: Secondary | ICD-10-CM

## 2020-04-16 DIAGNOSIS — R0789 Other chest pain: Secondary | ICD-10-CM

## 2020-04-16 MED ORDER — TIZANIDINE HCL 2 MG PO TABS: 2.0000 mg | ORAL_TABLET | Freq: Four times a day (QID) | ORAL | 0 refills | Status: AC | PRN

## 2020-04-16 MED ORDER — NAPROXEN 500 MG PO TABS
500.0000 mg | ORAL_TABLET | Freq: Two times a day (BID) | ORAL | 0 refills | Status: AC
Start: 2020-04-16 — End: ?

## 2020-04-16 NOTE — Discharge Instructions (Signed)
Chest x-ray normal EKG normal Please use Naprosyn twice daily over the next 2 weeks May supplement tizanidine at home/bedtime which is a muscle relaxer, may cause drowsiness Alternate ice and heat Please continue to monitor symptoms, follow-up if not improving or worsening

## 2020-04-16 NOTE — ED Provider Notes (Signed)
[facility]    CSN: [id number] Arrival date & time: 04/16/20  0809      History   Chief Complaint Chief Complaint  Patient presents with  . Shortness of Breath  . Chest Pain    HPI Samantha Stuart is a 45 y.o. female history tubal ligation, hysterectomy, presenting today for evaluation of chest pain and shortness of breath. Reports tightness on left side.  Patient had COVID in December 2021.  Reports mild URI symptoms and mainly fatigue with COVID.  Denies any significant complications during acute course.  She reports that since then she has had tightness especially on her left chest and left upper back which has been persistent.  Reports increased discomfort with exertion as well as certain movements.  She has also felt very short of breath which she describes as getting winded easily.  Will occur with talking as well.  She denies any persistent cough for persistent URI symptoms.  She denies fevers.  Denies history of DVT/PE.  Denies leg pain or leg swelling.  Denies exogenous estrogen use.  Denies tobacco use.  Denies recent travel/immobilization.  Denies personal history of any heart problems such as hypertension, DM.   HPI  Past Medical History:  Diagnosis Date  . Gestational diabetes   . Pre-eclampsia     Patient Active Problem List   Diagnosis Date Noted  . S/P hysterectomy 03/08/2016  . Viral syndrome 11/23/2010  . CONSTIPATION 09/02/2009  . CERVICAL LYMPHADENOPATHY 09/02/2009  . HOARSENESS 02/12/2009  . HEADACHE 06/28/2007  . DIABETES MELLITUS, GESTATIONAL 01/23/2007    Past Surgical History:  Procedure Laterality Date  . CESAREAN SECTION    . ROBOTIC ASSISTED TOTAL HYSTERECTOMY WITH SALPINGECTOMY Bilateral 03/08/2016   Procedure: ROBOTIC ASSISTED TOTAL HYSTERECTOMY WITH SALPINGECTOMY;  Surgeon: Servando Salina, MD;  Location: [facility];  Service: Gynecology;  Laterality: Bilateral;  . TUBAL LIGATION      OB History   No obstetric history on  file.      Home Medications    Prior to Admission medications   Medication Sig Start Date End Date Taking? Authorizing Provider  ibuprofen (ADVIL) 400 MG tablet Take 400 mg by mouth every 6 (six) hours as needed.   Yes [provider]  naproxen (NAPROSYN) 500 MG tablet Take 1 tablet (500 mg total) by mouth 2 (two) times daily. 04/16/20  Yes Delaine Canter C, PA-C  tiZANidine (ZANAFLEX) 2 MG tablet Take 1-2 tablets (2-4 mg total) by mouth every 6 (six) hours as needed for muscle spasms. 04/16/20  Yes Izola Teague, Elesa Hacker, PA-C    Family History Family History  Problem Relation Age of Onset  . Diabetes Other   . Hypertension Other   . Coronary artery disease Other   . Hypertension Father   . Heart Problems Mother   . Kidney disease Mother   . Breast cancer Maternal Aunt     Social History Social History   Tobacco Use  . Smoking status: Former Smoker    Packs/day: 0.50    Years: 4.00    Pack years: 2.00    Types: Cigarettes    Quit date: 04/16/2005    Years since quitting: 15.0  . Smokeless tobacco: Never Used  Vaping Use  . Vaping Use: Never used  Substance Use Topics  . Alcohol use: Yes    Comment: occasionally  . Drug use: No     Allergies   Augmentin [amoxicillin-pot clavulanate]   Review of Systems Review of Systems  Constitutional:  Negative for fatigue and fever.  HENT: Negative for congestion, sinus pressure and sore throat.   Eyes: Negative for photophobia, pain and visual disturbance.  Respiratory: Positive for shortness of breath. Negative for cough.   Cardiovascular: Positive for chest pain.  Gastrointestinal: Negative for abdominal pain, nausea and vomiting.  Genitourinary: Negative for decreased urine volume and hematuria.  Musculoskeletal: Negative for myalgias, neck pain and neck stiffness.  Neurological: Negative for dizziness, syncope, facial asymmetry, speech difficulty, weakness, light-headedness, numbness and headaches.     Physical  Exam Triage Vital Signs ED Triage Vitals  Enc Vitals Group     BP      Pulse      Resp      Temp      Temp src      SpO2      Weight      Height      Head Circumference      Peak Flow      Pain Score      Pain Loc      Pain Edu?      Excl. in [place]?    No data found.  Updated Vital Signs BP 136/85 (BP Location: Left Arm)   Pulse 72   Temp 98.4 F (36.9 C)   Resp 20   Ht 5\' 6"  (1.676 m)   Wt 135 lb (61.2 kg)   LMP 02/21/2016 (Exact Date)   SpO2 95%   BMI 21.79 kg/m   Visual Acuity Right Eye Distance:   Left Eye Distance:   Bilateral Distance:    Right Eye Near:   Left Eye Near:    Bilateral Near:     Physical Exam Vitals and nursing note reviewed.  Constitutional:      Appearance: She is well-developed.     Comments: No acute distress  HENT:     Head: Normocephalic and atraumatic.     Nose: Nose normal.  Eyes:     Conjunctiva/sclera: Conjunctivae normal.  Cardiovascular:     Rate and Rhythm: Normal rate and regular rhythm.  Pulmonary:     Effort: Pulmonary effort is normal. No respiratory distress.     Comments: Breathing comfortably at rest, CTABL, no wheezing, rales or other adventitious sounds auscultated Abdominal:     General: There is no distension.  Musculoskeletal:        General: Normal range of motion.     Cervical back: Neck supple.     Comments: Tenderness to palpation along left anterior chest extending into left periscapular areas  Bilateral lower legs symmetric without calf swelling or tenderness  Skin:    General: Skin is warm and dry.  Neurological:     Mental Status: She is alert and oriented to person, place, and time.      UC Treatments / Results  Labs (all labs ordered are listed, but only abnormal results are displayed) Labs Reviewed - No data to display  EKG   Radiology DG Chest 2 View  Result Date: 04/16/2020 CLINICAL DATA:  Chest tightness. Shortness of breath. History of COVID. EXAM: CHEST - 2 VIEW COMPARISON:   04/26/2014. FINDINGS: Mediastinum hilar structures normal. Heart size stable. No focal infiltrate. No pleural effusion or pneumothorax. Thoracic spine scoliosis. No acute bony abnormality. IMPRESSION: No acute cardiopulmonary disease. Electronically Signed   By: Marcello Moores  Register   On: 04/16/2020 09:08    Procedures Procedures (including critical care time)  Medications Ordered in UC Medications - No data to display  Initial  Impression / Assessment and Plan / UC Course  I have reviewed the triage vital signs and the nursing notes.  Pertinent labs & imaging results that were available during my care of the patient were reviewed by me and considered in my medical decision making (see chart for details).  Clinical Course as of 04/16/20 0943  Wed Apr 16, 2020  0835 EKG normal sinus rhythm, no acute signs of ischemia or infarction, isolated nonspecific T wave inversion in lead III. [HW]    Clinical Course User Index [HW] Ziaire Bieser C, PA-C    Chest x-ray normal, EKG reassuring, reproducible tenderness to touch in left anterior chest and upper thoracic area, recommending 1 to 2-week course of anti-inflammatories, muscle relaxers and continued monitoring.  Discussed following up with primary care if symptoms persisting.  Discussed strict return precautions. Patient verbalized understanding and is agreeable with plan.  Final Clinical Impressions(s) / UC Diagnoses   Final diagnoses:  Chest wall pain     Discharge Instructions     Chest x-ray normal EKG normal Please use Naprosyn twice daily over the next 2 weeks May supplement tizanidine at home/bedtime which is a muscle relaxer, may cause drowsiness Alternate ice and heat Please continue to monitor symptoms, follow-up if not improving or worsening    ED Prescriptions    Medication Sig Dispense Auth. Provider   naproxen (NAPROSYN) 500 MG tablet Take 1 tablet (500 mg total) by mouth 2 (two) times daily. 30 tablet Shiane Wenberg,  Joby Hershkowitz C, PA-C   tiZANidine (ZANAFLEX) 2 MG tablet Take 1-2 tablets (2-4 mg total) by mouth every 6 (six) hours as needed for muscle spasms. 30 tablet Lukka Black, [place] C, PA-C     PDMP not reviewed this encounter.   Janith Lima, [place] 04/16/20 [phone]

## 2020-04-16 NOTE — ED Triage Notes (Signed)
Pt presents to Urgent Care with c/o intermittent sob and cp, especially w/ exertion, since having COVID in December. Pt reports L shoulder pain this morning as well. Denies nausea.

## 2020-12-29 ENCOUNTER — Ambulatory Visit (INDEPENDENT_AMBULATORY_CARE_PROVIDER_SITE_OTHER): Payer: No Typology Code available for payment source

## 2020-12-29 ENCOUNTER — Other Ambulatory Visit: Payer: Self-pay

## 2020-12-29 ENCOUNTER — Ambulatory Visit
Admission: EM | Admit: 2020-12-29 | Discharge: 2020-12-29 | Disposition: A | Discharge status: Home / Self Care | Payer: No Typology Code available for payment source | Attending: Physician Assistant | Admitting: Physician Assistant

## 2020-12-29 DIAGNOSIS — R0789 Other chest pain: Secondary | ICD-10-CM | POA: Diagnosis not present

## 2020-12-29 DIAGNOSIS — J209 Acute bronchitis, unspecified: Secondary | ICD-10-CM | POA: Diagnosis not present

## 2020-12-29 MED ORDER — PREDNISONE 20 MG PO TABS: 40.0000 mg | ORAL_TABLET | Freq: Every day | ORAL | 0 refills | Status: AC

## 2020-12-29 MED ORDER — PSEUDOEPH-BROMPHEN-DM 30-2-10 MG/5ML PO SYRP: 5.0000 mL | ORAL_SOLUTION | Freq: Four times a day (QID) | ORAL | 0 refills | Status: AC | PRN

## 2020-12-29 NOTE — ED Provider Notes (Signed)
[facility]    CSN: [id number] Arrival date & time: 12/29/20  1322      History   Chief Complaint Chief Complaint  Patient presents with   Cough    HPI Samantha Stuart is a 45 y.o. female.   Patient here today for evaluation of chest congestion and cough this been ongoing for the last week or so.  She also reports some cold sensitivity.  She states when she takes deep breaths she will have some pain to the center of her chest.  She has not had any fever.  She denies sore throat.  She has tried over-the-counter medication without significant relief.  Patient is concerned she may have pneumonia.  The history is provided by the patient.  Cough Associated symptoms: no chills, no ear pain, no eye discharge, no fever, no shortness of breath, no sore throat and no wheezing    Past Medical History:  Diagnosis Date   Gestational diabetes    Pre-eclampsia     Patient Active Problem List   Diagnosis Date Noted   S/P hysterectomy 03/08/2016   Viral syndrome 11/23/2010   CONSTIPATION 09/02/2009   CERVICAL LYMPHADENOPATHY 09/02/2009   HOARSENESS 02/12/2009   HEADACHE 06/28/2007   DIABETES MELLITUS, GESTATIONAL 01/23/2007    Past Surgical History:  Procedure Laterality Date   CESAREAN SECTION     ROBOTIC ASSISTED TOTAL HYSTERECTOMY WITH SALPINGECTOMY Bilateral 03/08/2016   Procedure: ROBOTIC ASSISTED TOTAL HYSTERECTOMY WITH SALPINGECTOMY;  Surgeon: Servando Salina, MD;  Location: [facility];  Service: Gynecology;  Laterality: Bilateral;   TUBAL LIGATION      OB History   No obstetric history on file.      Home Medications    Prior to Admission medications   Medication Sig Start Date End Date Taking? Authorizing Provider  brompheniramine-pseudoephedrine-DM 30-2-10 MG/5ML syrup Take 5 mLs by mouth 4 (four) times daily as needed for up to 5 days. 12/29/20 01/03/21 Yes Francene Finders, PA-C  predniSONE (DELTASONE) 20 MG tablet Take 2 tablets (40 mg total) by  mouth daily with breakfast for 5 days. 12/29/20 01/03/21 Yes Francene Finders, PA-C  ibuprofen (ADVIL) 400 MG tablet Take 400 mg by mouth every 6 (six) hours as needed.    [provider]  naproxen (NAPROSYN) 500 MG tablet Take 1 tablet (500 mg total) by mouth 2 (two) times daily. 04/16/20   Wieters, Hallie C, PA-C  tiZANidine (ZANAFLEX) 2 MG tablet Take 1-2 tablets (2-4 mg total) by mouth every 6 (six) hours as needed for muscle spasms. 04/16/20   Wieters, Elesa Hacker, PA-C    Family History Family History  Problem Relation Age of Onset   Diabetes Other    Hypertension Other    Coronary artery disease Other    Hypertension Father    Heart Problems Mother    Kidney disease Mother    Breast cancer Maternal Aunt     Social History Social History   Tobacco Use   Smoking status: Former    Packs/day: 0.50    Years: 4.00    Pack years: 2.00    Types: Cigarettes    Quit date: 04/16/2005    Years since quitting: 15.7   Smokeless tobacco: Never  Vaping Use   Vaping Use: Never used  Substance Use Topics   Alcohol use: Yes    Comment: occasionally   Drug use: No     Allergies   Augmentin [amoxicillin-pot clavulanate]   Review of Systems Review of Systems  Constitutional:  Negative for chills and fever.  HENT:  Positive for congestion. Negative for ear pain and sore throat.   Eyes:  Negative for discharge and redness.  Respiratory:  Positive for cough and chest tightness. Negative for shortness of breath and wheezing.   Gastrointestinal:  Negative for abdominal pain, diarrhea, nausea and vomiting.    Physical Exam Triage Vital Signs ED Triage Vitals  Enc Vitals Group     BP 12/29/20 1537 125/74     Pulse Rate 12/29/20 1537 99     Resp 12/29/20 1537 18     Temp 12/29/20 1537 99.8 F (37.7 C)     Temp Source 12/29/20 1537 Oral     SpO2 12/29/20 1537 97 %     Weight --      Height --      Head Circumference --      Peak Flow --      Pain Score 12/29/20 1538 0      Pain Loc --      Pain Edu? --      Excl. in [place]? --    No data found.  Updated Vital Signs BP 125/74 (BP Location: Left Arm)    Pulse 99    Temp 99.8 F (37.7 C) (Oral)    Resp 18    LMP 02/21/2016 (Exact Date)    SpO2 97%      Physical Exam Vitals and nursing note reviewed.  Constitutional:      General: She is not in acute distress.    Appearance: Normal appearance. She is not ill-appearing.  HENT:     Head: Normocephalic and atraumatic.     Nose: Nose normal. No congestion or rhinorrhea.  Eyes:     Conjunctiva/sclera: Conjunctivae normal.  Cardiovascular:     Rate and Rhythm: Normal rate and regular rhythm.     Heart sounds: Normal heart sounds. No murmur heard. Pulmonary:     Effort: Pulmonary effort is normal. No respiratory distress.     Breath sounds: Normal breath sounds. No wheezing, rhonchi or rales.  Skin:    General: Skin is warm and dry.  Neurological:     Mental Status: She is alert.  Psychiatric:        Mood and Affect: Mood normal.        Thought Content: Thought content normal.     UC Treatments / Results  Labs (all labs ordered are listed, but only abnormal results are displayed) Labs Reviewed - No data to display  EKG   Radiology DG Chest 2 View  Result Date: 12/29/2020 CLINICAL DATA:  Chest tightness. EXAM: CHEST - 2 VIEW COMPARISON:  Chest x-ray 04/16/2020. FINDINGS: The heart size and mediastinal contours are within normal limits. Both lungs are clear. The visualized skeletal structures are unremarkable. IMPRESSION: No active cardiopulmonary disease. Electronically Signed   By: Ronney Asters M.D.   On: 12/29/2020 16:15    Procedures Procedures (including critical care time)  Medications Ordered in UC Medications - No data to display  Initial Impression / Assessment and Plan / UC Course  I have reviewed the triage vital signs and the nursing notes.  Pertinent labs & imaging results that were available during my care of the patient were  reviewed by me and considered in my medical decision making (see chart for details).    Discussed that x-ray was clear and most likely she is suffering from bronchitis.  Patient declines COVID or flu screening.  Prednisone and Bromfed  prescribed.  Recommended follow-up if symptoms fail to improve or worsen.  Final Clinical Impressions(s) / UC Diagnoses   Final diagnoses:  Acute bronchitis, unspecified organism   Discharge Instructions   None    ED Prescriptions     Medication Sig Dispense Auth. Provider   predniSONE (DELTASONE) 20 MG tablet Take 2 tablets (40 mg total) by mouth daily with breakfast for 5 days. 10 tablet Francene Finders, PA-C   brompheniramine-pseudoephedrine-DM 30-2-10 MG/5ML syrup Take 5 mLs by mouth 4 (four) times daily as needed for up to 5 days. 100 mL Francene Finders, PA-C      PDMP not reviewed this encounter.   Francene Finders, PA-C 12/29/20 [phone]

## 2020-12-29 NOTE — ED Triage Notes (Signed)
Pt c/o chest tightness, cough, cold sensitivity,   Denies sore throat, nasal congestion, earache, headache, nausea, vomiting, diarrhea, constipation.

## 2021-04-27 IMAGING — DX DG CHEST 2V
2 series · 2 of 2 positions shown · non-contrast
Comparison: 04/26/2014.

CLINICAL DATA: Chest tightness. Shortness of breath. History of
COVID.

EXAM:
CHEST - 2 VIEW

[chest pa]
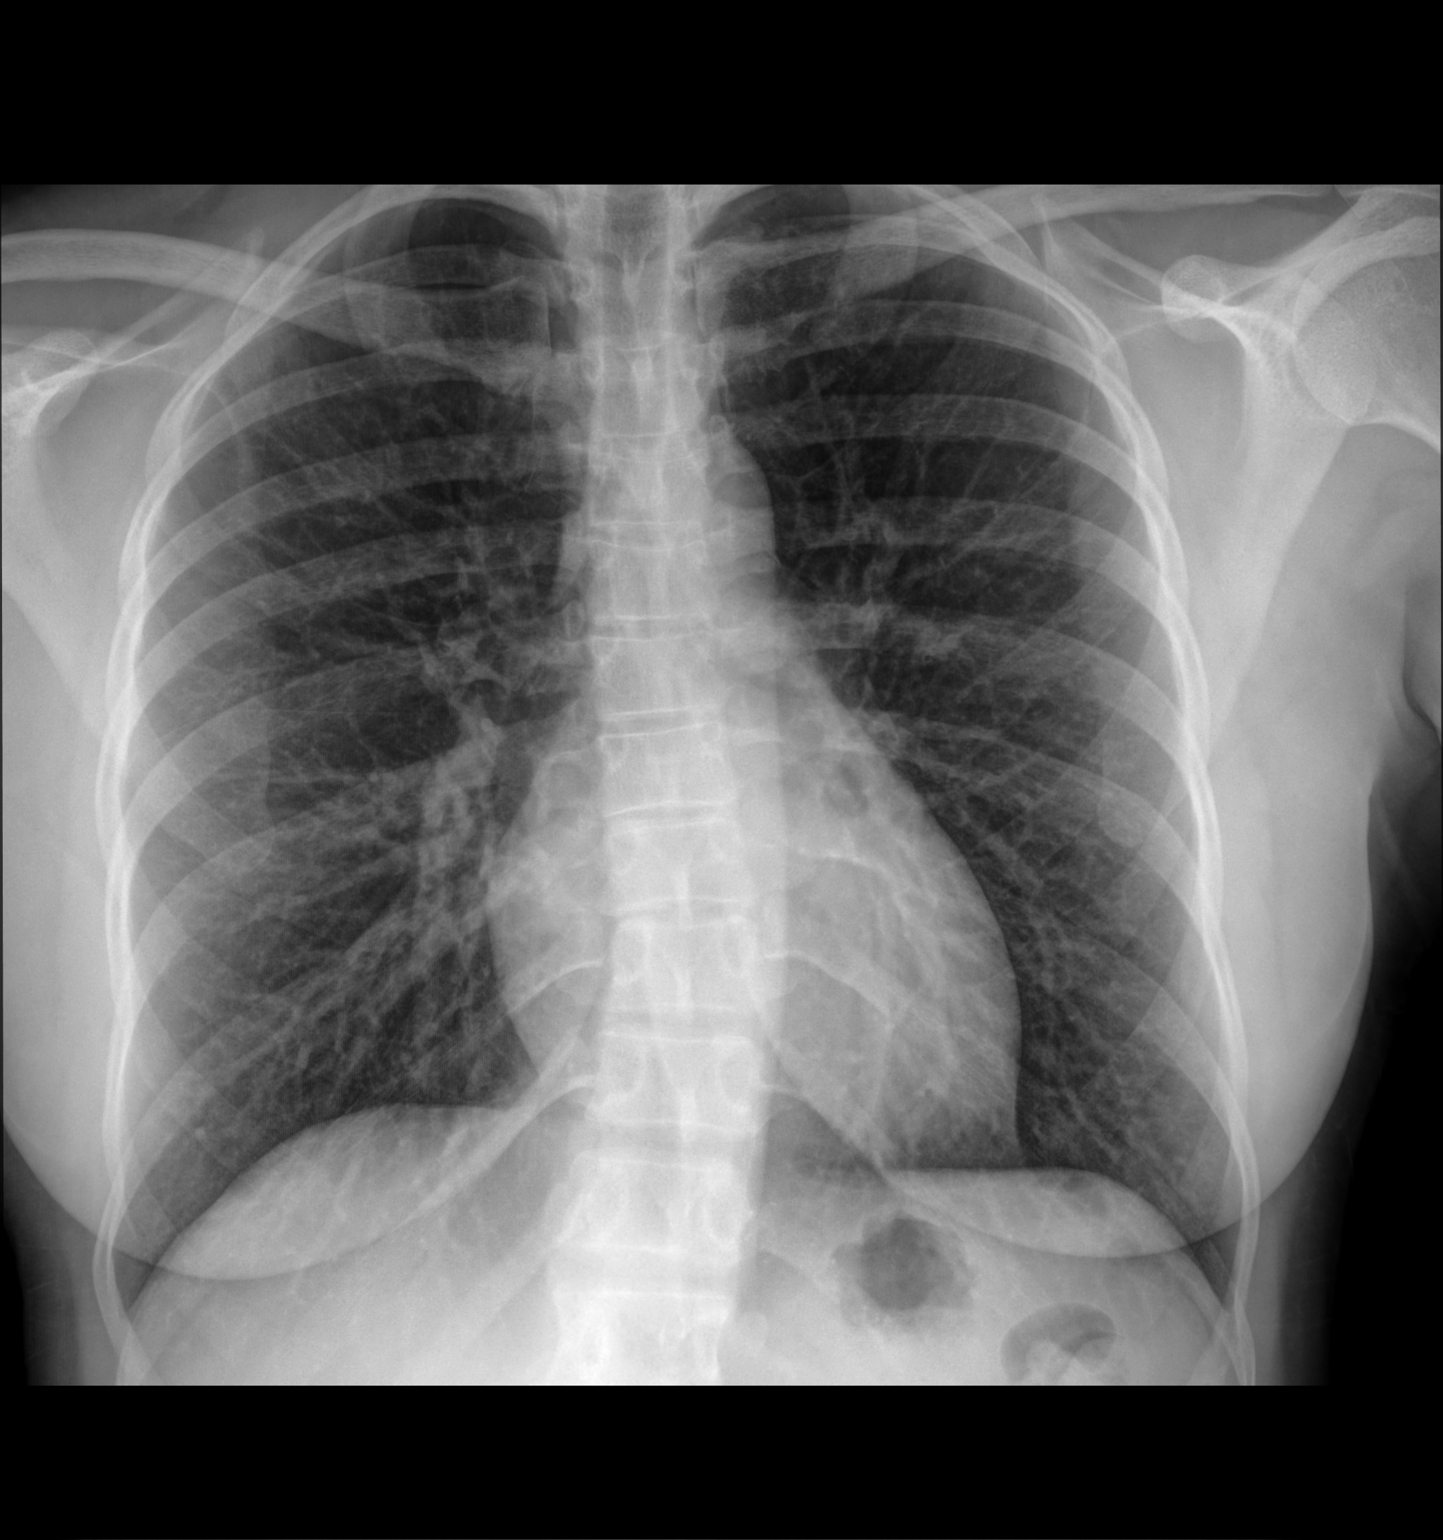

[chest lat]
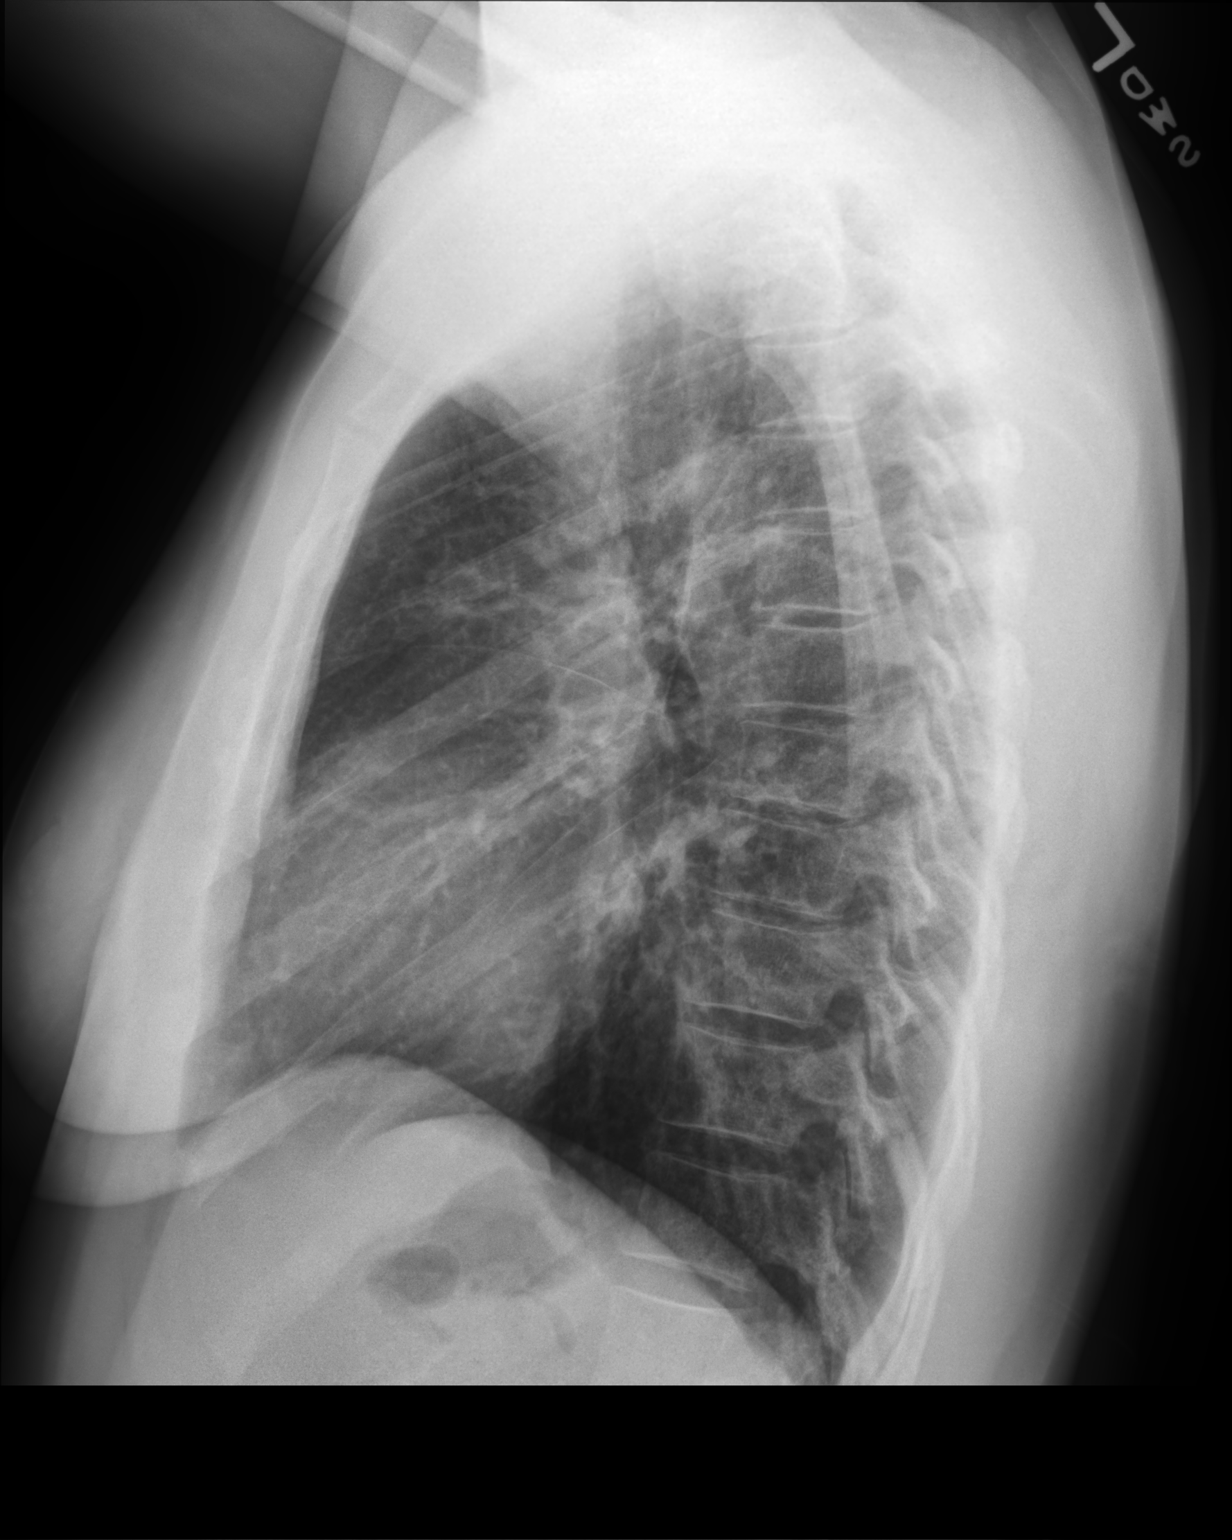

[2 of 2 positions shown; findings below may reference images not displayed]

FINDINGS: Mediastinum hilar structures normal. Heart size stable. No focal
infiltrate. No pleural effusion or pneumothorax. Thoracic spine
scoliosis. No acute bony abnormality.
IMPRESSION: No acute cardiopulmonary disease.

## 2023-02-17 ENCOUNTER — Emergency Department (HOSPITAL_BASED_OUTPATIENT_CLINIC_OR_DEPARTMENT_OTHER): Payer: No Typology Code available for payment source | Admitting: Radiology

## 2023-02-17 ENCOUNTER — Encounter (HOSPITAL_BASED_OUTPATIENT_CLINIC_OR_DEPARTMENT_OTHER): Payer: Self-pay | Admitting: Emergency Medicine

## 2023-02-17 DIAGNOSIS — Z20822 Contact with and (suspected) exposure to covid-19: Secondary | ICD-10-CM | POA: Insufficient documentation

## 2023-02-17 DIAGNOSIS — R0789 Other chest pain: Secondary | ICD-10-CM | POA: Diagnosis not present

## 2023-02-17 DIAGNOSIS — R519 Headache, unspecified: Secondary | ICD-10-CM | POA: Diagnosis not present

## 2023-02-17 DIAGNOSIS — R079 Chest pain, unspecified: Secondary | ICD-10-CM | POA: Diagnosis present

## 2023-02-17 LAB — BASIC METABOLIC PANEL
Anion gap: 10 (ref 5–15)
BUN: 9 mg/dL (ref 6–20)
CO2: 22 mmol/L (ref 22–32)
Calcium: 9 mg/dL (ref 8.9–10.3)
Chloride: 105 mmol/L (ref 98–111)
Creatinine, Ser: 1.11 mg/dL — ABNORMAL HIGH (ref 0.44–1.00)
GFR, Estimated: >60 mL/min (ref >60)
Glucose, Bld: 138 mg/dL — ABNORMAL HIGH (ref 70–99)
Potassium: 3.9 mmol/L (ref 3.5–5.1)
Sodium: 137 mmol/L (ref 135–145)

## 2023-02-17 LAB — CBC
HCT: 37 % (ref 36.0–46.0)
Hemoglobin: 12.3 g/dL (ref 12.0–15.0)
MCH: 30 pg (ref 26.0–34.0)
MCHC: 33.2 g/dL (ref 30.0–36.0)
MCV: 90.2 fL (ref 80.0–100.0)
Platelets: 235 10*3/uL (ref 150–400)
RBC: 4.1 MIL/uL (ref 3.87–5.11)
RDW: 12.7 % (ref 11.5–15.5)
WBC: 5 10*3/uL (ref 4.0–10.5)
nRBC: 0 % (ref 0.0–0.2)

## 2023-02-17 LAB — RESP PANEL BY RT-PCR (RSV, FLU A&B, COVID)  RVPGX2
Influenza A by PCR: NEGATIVE
Influenza B by PCR: NEGATIVE
Resp Syncytial Virus by PCR: NEGATIVE
SARS Coronavirus 2 by RT PCR: NEGATIVE

## 2023-02-17 LAB — TROPONIN I (HIGH SENSITIVITY): Troponin I (High Sensitivity): 4 ng/L (ref <18)

## 2023-02-17 NOTE — ED Triage Notes (Signed)
 Chest pain started yesterday. Migraine x 2 days  Numbness in right had (comes and goes) not happening at present Tylenol  without relief for migraine "I just feel a little weird"

## 2023-02-18 ENCOUNTER — Emergency Department (HOSPITAL_BASED_OUTPATIENT_CLINIC_OR_DEPARTMENT_OTHER)
Admission: EM | Admit: 2023-02-18 | Discharge: 2023-02-18 | Disposition: A | Discharge status: Home / Self Care | Payer: No Typology Code available for payment source | Attending: Emergency Medicine | Admitting: Emergency Medicine

## 2023-02-18 DIAGNOSIS — R0789 Other chest pain: Secondary | ICD-10-CM

## 2023-02-18 DIAGNOSIS — R519 Headache, unspecified: Secondary | ICD-10-CM

## 2023-02-18 DIAGNOSIS — J3489 Other specified disorders of nose and nasal sinuses: Secondary | ICD-10-CM

## 2023-02-18 LAB — TROPONIN I (HIGH SENSITIVITY): Troponin I (High Sensitivity): 5 ng/L (ref <18)

## 2023-02-18 MED ORDER — KETOROLAC TROMETHAMINE 60 MG/2ML IM SOLN
30.0000 mg | Freq: Once | INTRAMUSCULAR | Status: AC
  Administered 2023-02-18: 30 mg via INTRAMUSCULAR
  Filled 2023-02-18: qty 2

## 2023-02-18 MED ORDER — PROCHLORPERAZINE MALEATE 10 MG PO TABS: 10.0000 mg | ORAL_TABLET | Freq: Two times a day (BID) | ORAL | 0 refills | Status: AC | PRN

## 2023-02-18 NOTE — Discharge Instructions (Addendum)
 You may take over-the-counter medicine for symptomatic relief, such as Tylenol, Motrin, TheraFlu, Alka seltzer , black elderberry, etc. Please limit acetaminophen (Tylenol) to 4000 mg and Ibuprofen (Motrin, Advil, etc.) to 2400 mg for a 24hr period. Please note that other over-the-counter medicine may contain acetaminophen or ibuprofen as a component of their ingredients.

## 2023-02-18 NOTE — ED Notes (Signed)
 AVS provided by edp was reviewed with pt. Pt verbalized understanding with no additional questions at this time.

## 2023-02-18 NOTE — ED Provider Notes (Signed)
 [place] EMERGENCY DEPARTMENT AT [facility] Provider Note  CSN: [id number] Arrival date & time: 02/17/23 1956  Chief Complaint(s) Chest Pain  HPI Samantha Stuart is a 48 y.o. female who presents to the emergency department for several days of generalized malaise, chills, myalgias, chest wall pain, and headache.  Patient denies any fevers.  No known sick contacts.  No coughing or congestion.  No nausea or vomiting.  No abdominal pain.  The history is provided by the patient.    Past Medical History Past Medical History:  Diagnosis Date   Gestational diabetes    Pre-eclampsia    Patient Active Problem List   Diagnosis Date Noted   S/P hysterectomy 03/08/2016   Viral syndrome 11/23/2010   Constipation 09/02/2009   Enlarged lymph nodes 09/02/2009   HOARSENESS 02/12/2009   Headache 06/28/2007   DIABETES MELLITUS, GESTATIONAL 01/23/2007   Home Medication(s) Prior to Admission medications   Medication Sig Start Date End Date Taking? Authorizing Provider  prochlorperazine  (COMPAZINE ) 10 MG tablet Take 1 tablet (10 mg total) by mouth 2 (two) times daily as needed for nausea or vomiting. 02/18/23  Yes Gregrey Bloyd, Raynell Moder, MD  ibuprofen  (ADVIL ) 400 MG tablet Take 400 mg by mouth every 6 (six) hours as needed.    [provider]  naproxen  (NAPROSYN ) 500 MG tablet Take 1 tablet (500 mg total) by mouth 2 (two) times daily. 04/16/20   Wieters, Hallie C, PA-C  tiZANidine  (ZANAFLEX ) 2 MG tablet Take 1-2 tablets (2-4 mg total) by mouth every 6 (six) hours as needed for muscle spasms. 04/16/20   Wieters, Hallie C, PA-C                                                                                                                                    Allergies Augmentin  [amoxicillin -pot clavulanate]  Review of Systems Review of Systems As noted in HPI  Physical Exam Vital Signs  I have reviewed the triage vital signs BP 107/68 (BP Location: Right Arm)   Pulse 62   Temp  97.6 F (36.4 C) (Temporal)   Resp 19   LMP 02/21/2016 (Exact Date)   SpO2 99%   Physical Exam Vitals reviewed.  Constitutional:      General: She is not in acute distress.    Appearance: She is well-developed. She is not diaphoretic.  HENT:     Head: Normocephalic and atraumatic.     Nose: Rhinorrhea present.  Eyes:     General: No scleral icterus.       Right eye: No discharge.        Left eye: No discharge.     Conjunctiva/sclera: Conjunctivae normal.     Pupils: Pupils are equal, round, and reactive to light.  Cardiovascular:     Rate and Rhythm: Normal rate and regular rhythm.     Heart sounds: No murmur heard.    No friction rub. No gallop.  Pulmonary:     Effort: Pulmonary effort is normal. No respiratory distress.     Breath sounds: Normal breath sounds. No stridor. No rales.  Abdominal:     General: There is no distension.     Palpations: Abdomen is soft.     Tenderness: There is no abdominal tenderness.  Musculoskeletal:        General: No tenderness.     Cervical back: Normal range of motion and neck supple.  Skin:    General: Skin is warm and dry.     Findings: No erythema or rash.  Neurological:     Mental Status: She is alert and oriented to person, place, and time.     ED Results and Treatments Labs (all labs ordered are listed, but only abnormal results are displayed) Labs Reviewed  BASIC METABOLIC PANEL - Abnormal; Notable for the following components:      Result Value   Glucose, Bld 138 (*)    Creatinine, Ser 1.11 (*)    All other components within normal limits  RESP PANEL BY RT-PCR (RSV, FLU A&B, COVID)  [id number]  CBC  TROPONIN I (HIGH SENSITIVITY)  TROPONIN I (HIGH SENSITIVITY)                                                                                                                         EKG  EKG Interpretation Date/Time:  Thursday February 17 2023 20:07:03 EST Ventricular Rate:  76 PR Interval:  168 QRS Duration:  76 QT  Interval:  394 QTC Calculation: 443 R Axis:   61  Text Interpretation: Normal sinus rhythm Low voltage QRS Cannot rule out Anterior infarct , age undetermined Abnormal ECG When compared with ECG of 07-Jan-2018 15:21, No significant change was found Confirmed by Trine Likes [phone]) on 02/18/2023 2:33:13 AM       Radiology DG Chest 2 View Result Date: 02/17/2023 CLINICAL DATA:  Chest pain. EXAM: CHEST - 2 VIEW COMPARISON:  December 29, 2020 FINDINGS: The heart size and mediastinal contours are within normal limits. Both lungs are clear. There is mild dextroscoliosis of the lower thoracic and upper lumbar spine. The visualized skeletal structures are otherwise unremarkable. IMPRESSION: No active cardiopulmonary disease. Electronically Signed   By: Suzen Dials M.D.   On: 02/17/2023 20:23    Medications Ordered in ED Medications  ketorolac  (TORADOL ) injection 30 mg (30 mg Intramuscular Given 02/18/23 0301)   Procedures Procedures  (including critical care time) Medical Decision Making / ED Course   Medical Decision Making Amount and/or Complexity of Data Reviewed Labs: ordered. Radiology: ordered.  Risk Prescription drug management.    Patient presents with viral symptoms for 3 days. adequate oral hydration. Rest of history as above.  Patient appears well. No signs of toxicity, patient is interactive. No hypoxia, tachypnea or other signs of respiratory distress. No sign of clinical dehydration. Lung exam clear. Rest of exam as above.  Labs are grossly reassuring without leukocytosis or anemia, no significant  electrolyte derangements.  Mild hyperglycemia without DKA.  Mild renal sufficiency without AKI.  Viral panel negative for COVID, flu, RSV.  EKG without acute ischemic changes, dysrhythmias or blocks.  Serial troponins negative.  Chest pain is MSK related not concerning for ACS.  Doubt PE, dissection.  Chest x-ray without evidence of pneumonia, pneumothorax, pulmonary edema  pleural effusions.  Most consistent with viral illness   No evidence suggestive of pharyngitis, AOM, PNA, or meningitis.  Discussed symptomatic treatment with the patient and they will follow closely with their PCP.      Final Clinical Impression(s) / ED Diagnoses Final diagnoses:  Sinus pain  Generalized headache  Chest wall pain   The patient appears reasonably screened and/or stabilized for discharge and I doubt any other medical condition or other [facility] requiring further screening, evaluation, or treatment in the ED at this time. I have discussed the findings, Dx and Tx plan with the patient/family who expressed understanding and agree(s) with the plan. Discharge instructions discussed at length. The patient/family was given strict return precautions who verbalized understanding of the instructions. No further questions at time of discharge.  Disposition: Discharge  Condition: Good  ED Discharge Orders          Ordered    prochlorperazine  (COMPAZINE ) 10 MG tablet  2 times daily PRN        02/18/23 0327              Follow Up: Johnny Garnette LABOR, MD [address] [place] [address] [phone]  Call      This chart was dictated using voice recognition software.  Despite best efforts to proofread,  errors can occur which can change the documentation meaning.    Trine Raynell Moder, MD 02/18/23 [phone]

## 2023-02-18 NOTE — ED Notes (Signed)
 Provider at bedside
